# Patient Record
Sex: Female | Born: 1970
Health system: Southern US, Community
[De-identification: ages and names within clinical notes are randomized; demographics above are authoritative.]

## PROBLEM LIST (undated history)

## (undated) DIAGNOSIS — M797 Fibromyalgia: Secondary | ICD-10-CM

## (undated) DIAGNOSIS — Z8614 Personal history of Methicillin resistant Staphylococcus aureus infection: Secondary | ICD-10-CM

## (undated) DIAGNOSIS — K589 Irritable bowel syndrome without diarrhea: Secondary | ICD-10-CM

## (undated) DIAGNOSIS — F319 Bipolar disorder, unspecified: Secondary | ICD-10-CM

## (undated) HISTORY — DX: Bipolar disorder, unspecified: F31.9

## (undated) HISTORY — DX: Irritable bowel syndrome, unspecified: K58.9

## (undated) HISTORY — DX: Fibromyalgia: M79.7

---

## 1993-09-04 HISTORY — PX: WISDOM TOOTH EXTRACTION: SHX21

## 1997-09-04 HISTORY — PX: OTHER SURGICAL HISTORY: SHX169

## 2004-09-04 HISTORY — PX: DILATION AND CURETTAGE OF UTERUS: SHX78

## 2005-09-29 ENCOUNTER — Ambulatory Visit: Payer: Self-pay

## 2006-06-08 ENCOUNTER — Observation Stay: Payer: Self-pay | Admitting: Certified Nurse Midwife

## 2006-06-18 ENCOUNTER — Ambulatory Visit: Payer: Self-pay | Admitting: Urology

## 2006-07-04 ENCOUNTER — Ambulatory Visit: Payer: Self-pay | Admitting: Urology

## 2006-08-13 ENCOUNTER — Encounter: Payer: Self-pay | Admitting: Maternal & Fetal Medicine

## 2006-08-13 ENCOUNTER — Observation Stay: Payer: Self-pay

## 2006-09-07 ENCOUNTER — Inpatient Hospital Stay: Payer: Self-pay | Admitting: Obstetrics and Gynecology

## 2007-08-16 ENCOUNTER — Ambulatory Visit: Payer: Self-pay

## 2008-09-04 HISTORY — PX: AUGMENTATION MAMMAPLASTY: SUR837

## 2008-09-04 HISTORY — PX: BREAST ENHANCEMENT SURGERY: SHX7

## 2008-10-26 ENCOUNTER — Encounter: Admission: RE | Admit: 2008-10-26 | Discharge: 2008-10-26 | Payer: Self-pay | Admitting: Plastic Surgery

## 2009-10-28 ENCOUNTER — Encounter: Admission: RE | Admit: 2009-10-28 | Discharge: 2009-10-28 | Payer: Self-pay | Admitting: Obstetrics and Gynecology

## 2010-09-24 ENCOUNTER — Other Ambulatory Visit: Payer: Self-pay | Admitting: *Deleted

## 2010-09-24 DIAGNOSIS — Z1239 Encounter for other screening for malignant neoplasm of breast: Secondary | ICD-10-CM

## 2010-11-02 ENCOUNTER — Other Ambulatory Visit: Payer: Self-pay | Admitting: *Deleted

## 2010-11-02 ENCOUNTER — Ambulatory Visit
Admission: RE | Admit: 2010-11-02 | Discharge: 2010-11-02 | Disposition: A | Discharge status: Home / Self Care | Payer: BC Managed Care – PPO | Source: Ambulatory Visit | Attending: *Deleted | Admitting: *Deleted

## 2010-11-02 DIAGNOSIS — Z1239 Encounter for other screening for malignant neoplasm of breast: Secondary | ICD-10-CM

## 2011-10-05 ENCOUNTER — Other Ambulatory Visit: Payer: Self-pay | Admitting: Obstetrics and Gynecology

## 2011-10-05 DIAGNOSIS — Z1231 Encounter for screening mammogram for malignant neoplasm of breast: Secondary | ICD-10-CM

## 2011-11-08 ENCOUNTER — Ambulatory Visit
Admission: RE | Admit: 2011-11-08 | Discharge: 2011-11-08 | Disposition: A | Discharge status: Home / Self Care | Payer: BC Managed Care – PPO | Source: Ambulatory Visit | Attending: Obstetrics and Gynecology | Admitting: Obstetrics and Gynecology

## 2011-11-08 DIAGNOSIS — Z1231 Encounter for screening mammogram for malignant neoplasm of breast: Secondary | ICD-10-CM

## 2012-10-09 ENCOUNTER — Other Ambulatory Visit: Payer: Self-pay | Admitting: Obstetrics and Gynecology

## 2012-10-09 DIAGNOSIS — Z1231 Encounter for screening mammogram for malignant neoplasm of breast: Secondary | ICD-10-CM

## 2012-11-13 ENCOUNTER — Ambulatory Visit
Admission: RE | Admit: 2012-11-13 | Discharge: 2012-11-13 | Disposition: A | Discharge status: Home / Self Care | Payer: BC Managed Care – PPO | Source: Ambulatory Visit | Attending: Obstetrics and Gynecology | Admitting: Obstetrics and Gynecology

## 2012-11-13 DIAGNOSIS — Z1231 Encounter for screening mammogram for malignant neoplasm of breast: Secondary | ICD-10-CM

## 2012-12-24 ENCOUNTER — Ambulatory Visit: Payer: Self-pay | Admitting: Family Medicine

## 2013-10-22 ENCOUNTER — Other Ambulatory Visit: Payer: Self-pay

## 2013-10-22 DIAGNOSIS — Z1231 Encounter for screening mammogram for malignant neoplasm of breast: Secondary | ICD-10-CM

## 2013-11-19 ENCOUNTER — Ambulatory Visit
Admission: RE | Admit: 2013-11-19 | Discharge: 2013-11-19 | Disposition: A | Discharge status: Home / Self Care | Payer: BC Managed Care – PPO | Source: Ambulatory Visit

## 2013-11-19 DIAGNOSIS — Z1231 Encounter for screening mammogram for malignant neoplasm of breast: Secondary | ICD-10-CM

## 2014-10-20 ENCOUNTER — Other Ambulatory Visit: Payer: Self-pay

## 2014-10-20 DIAGNOSIS — Z1231 Encounter for screening mammogram for malignant neoplasm of breast: Secondary | ICD-10-CM

## 2015-01-19 ENCOUNTER — Ambulatory Visit
Admission: RE | Admit: 2015-01-19 | Discharge: 2015-01-19 | Disposition: A | Discharge status: Home / Self Care | Payer: BLUE CROSS/BLUE SHIELD | Source: Ambulatory Visit

## 2015-01-19 DIAGNOSIS — Z1231 Encounter for screening mammogram for malignant neoplasm of breast: Secondary | ICD-10-CM

## 2015-01-21 ENCOUNTER — Other Ambulatory Visit: Payer: Self-pay | Admitting: Family Medicine

## 2015-01-21 DIAGNOSIS — R928 Other abnormal and inconclusive findings on diagnostic imaging of breast: Secondary | ICD-10-CM

## 2015-01-26 ENCOUNTER — Ambulatory Visit
Admission: RE | Admit: 2015-01-26 | Discharge: 2015-01-26 | Disposition: A | Discharge status: Home / Self Care | Payer: BLUE CROSS/BLUE SHIELD | Source: Ambulatory Visit | Attending: Family Medicine | Admitting: Family Medicine

## 2015-01-26 DIAGNOSIS — R928 Other abnormal and inconclusive findings on diagnostic imaging of breast: Secondary | ICD-10-CM

## 2015-06-30 ENCOUNTER — Encounter: Payer: Self-pay | Admitting: Physician Assistant

## 2015-06-30 ENCOUNTER — Ambulatory Visit (INDEPENDENT_AMBULATORY_CARE_PROVIDER_SITE_OTHER): Payer: BLUE CROSS/BLUE SHIELD | Admitting: Physician Assistant

## 2015-06-30 VITALS — BP 108/70 | HR 94 | Temp 98.7°F | Resp 14 | Ht 63.0 in | Wt 132.8 lb

## 2015-06-30 DIAGNOSIS — M797 Fibromyalgia: Secondary | ICD-10-CM | POA: Insufficient documentation

## 2015-06-30 DIAGNOSIS — K589 Irritable bowel syndrome without diarrhea: Secondary | ICD-10-CM | POA: Insufficient documentation

## 2015-06-30 DIAGNOSIS — B379 Candidiasis, unspecified: Secondary | ICD-10-CM

## 2015-06-30 DIAGNOSIS — M545 Low back pain, unspecified: Secondary | ICD-10-CM

## 2015-06-30 DIAGNOSIS — N898 Other specified noninflammatory disorders of vagina: Secondary | ICD-10-CM

## 2015-06-30 DIAGNOSIS — Z8659 Personal history of other mental and behavioral disorders: Secondary | ICD-10-CM | POA: Insufficient documentation

## 2015-06-30 LAB — POCT URINALYSIS DIPSTICK
Bilirubin, UA: NEGATIVE
GLUCOSE UA: NEGATIVE
Ketones, UA: NEGATIVE
LEUKOCYTES UA: NEGATIVE
NITRITE UA: NEGATIVE
PH UA: 7
Protein, UA: NEGATIVE
RBC UA: NEGATIVE
Spec Grav, UA: 1.01
UROBILINOGEN UA: 0.2

## 2015-06-30 LAB — POCT URINE PREGNANCY: PREG TEST UR: NEGATIVE

## 2015-06-30 MED ORDER — FLUCONAZOLE 150 MG PO TABS: ORAL_TABLET | ORAL | Status: DC

## 2015-06-30 NOTE — Patient Instructions (Signed)

## 2015-07-01 LAB — POCT WET PREP (WET MOUNT)

## 2015-07-01 NOTE — Progress Notes (Signed)
Patient ID: Tanya Cochran, female   DOB: 09/30/1970, 44 y.o.   MRN: [id number] Name: Tanya Cochran   MRN: [id number]    DOB: 08/26/1971   Date:07/01/2015       Progress Note  Subjective  Chief Complaint  Chief Complaint  Patient presents with  . Back Pain    Patient comes in office today with concerns of lower back pain and pelvic pain for the past 4 days. Patient states that she is near ovulation but has noticed a milky like discharge, patient denies vaginal odor or itching    HPI [name] is a 44 year old female that comes to the office today with new onset low back pain and left-sided pelvic pain. She has also had a thin whitish colored vaginal discharge. She also reports some mild vaginal itching. She states she did have a yeast infection in July of this year. She also reports that she does have a new sexual partner that she had unprotected sex with recently and would like to be checked to make sure she is okay. She does also have a history of ovarian cyst. She has had these since she was in her 12s.  No problem-specific assessment & plan notes found for this encounter.   Past Medical History  Diagnosis Date  . Bipolar disorder (HCC)   . Fibromyalgia   . Irritable bowel syndrome (IBS)     Social History  Substance Use Topics  . Smoking status: Never Smoker   . Smokeless tobacco: Never Used  . Alcohol Use: 0.0 oz/week    0 Standard drinks or equivalent per week     Comment: occasional     Current outpatient prescriptions:  .  lamoTRIgine (LAMICTAL) 200 MG tablet, Take 200 mg by mouth at bedtime., Disp: , Rfl: 4 .  SEROQUEL XR 200 MG 24 hr tablet, Take 200 mg by mouth at bedtime., Disp: , Rfl: 4 .  fluconazole (DIFLUCAN) 150 MG tablet, Take one tablet PO once; if needed repeat in 24 hours; may repeat a third time in 72 hours following second tab if needed, Disp: 3 tablet, Rfl: 0  No Known Allergies  Review of Systems  Constitutional: Negative for fever, chills and  malaise/fatigue.  HENT: Negative.   Eyes: Negative.   Respiratory: Negative.   Cardiovascular: Negative.   Gastrointestinal: Positive for abdominal pain (left sided pelvic pain). Negative for nausea, vomiting, diarrhea and constipation.  Genitourinary: Negative.   Musculoskeletal: Positive for back pain (low back).  Skin: Positive for itching (vaginal).   Objective  Filed Vitals:   06/30/15 1551  BP: 108/70  Pulse: 94  Temp: 98.7 F (37.1 C)  TempSrc: Oral  Resp: 14  Height:  (1.6 m)  Weight: 132 lb 12.8 oz (60.238 kg)     Physical Exam  Constitutional: She is well-developed, well-nourished, and in no distress. No distress.  Cardiovascular: Normal rate and regular rhythm.  Exam reveals no gallop and no friction rub.   No murmur heard. Pulmonary/Chest: Effort normal and breath sounds normal. No respiratory distress. She has no wheezes. She has no rales.  Abdominal: Soft. Bowel sounds are normal. She exhibits no mass. There is no tenderness. There is no rebound.  Genitourinary: Cervix normal, right adnexa normal, left adnexa normal and vulva normal. Right adnexum displays no mass. Left adnexum displays no mass. Vagina exhibits normal mucosa, no exudate, no lesion and no rugosity. Thin  odorless  white and vaginal discharge found.  Skin: She is not diaphoretic.  Vitals reviewed.    Recent Results (from the past 2160 hour(s))  POCT urinalysis dipstick     Status: Normal   Collection Time: 06/30/15  3:49 PM  Result Value Ref Range   Color, UA yellow    Clarity, UA clear    Glucose, UA negative    Bilirubin, UA negative    Ketones, UA negative    Spec Grav, UA 1.010    Blood, UA negative    pH, UA 7.0    Protein, UA negative    Urobilinogen, UA 0.2    Nitrite, UA negative    Leukocytes, UA Negative Negative  POCT urine pregnancy     Status: Normal   Collection Time: 06/30/15  3:58 PM  Result Value Ref Range   Preg Test, Ur Negative Negative  POCT Wet Prep Mellody Drown([facility]  [place])     Status: Abnormal   Collection Time: 07/01/15  6:45 PM  Result Value Ref Range   Source Wet Prep POC vaginal    WBC, Wet Prep HPF POC 0-3    Bacteria Wet Prep HPF POC None None, Few, Too numerous to count   BACTERIA WET PREP MORPHOLOGY POC     Clue Cells Wet Prep HPF POC None None, Too numerous to count   Clue Cells Wet Prep Whiff POC     Yeast Wet Prep HPF POC Few    KOH Wet Prep POC     Trichomonas Wet Prep HPF POC none      Assessment & Plan  1. Yeast infection Upon evaluation of the wet prep in the office she did have yeast present. I will treat her with Diflucan as below. She is to call the office if symptoms fail to improve or worsen. - fluconazole (DIFLUCAN) 150 MG tablet; Take one tablet PO once; if needed repeat in 24 hours; may repeat a third time in 72 hours following second tab if needed  Dispense: 3 tablet; Refill: 0  2. Bilateral low back pain without sciatica UA was negative for UTI. Urine pregnancy was negative as well. Yeast were seen on wet prep and was treated with Diflucan as above. - POCT urinalysis dipstick - POCT urine pregnancy - POCT Wet Prep [facility] [place])  3. Vaginal discharge See above medical treatment plan for yeast infection. - POCT urine pregnancy - POCT Wet [place] ([place]) - GC/Chlamydia Probe Amp - Body Fluid Culture

## 2015-07-02 ENCOUNTER — Telehealth: Payer: Self-pay

## 2015-07-02 LAB — GC/CHLAMYDIA PROBE AMP
CHLAMYDIA, DNA PROBE: NEGATIVE
NEISSERIA GONORRHOEAE BY PCR: NEGATIVE

## 2015-07-02 NOTE — Telephone Encounter (Signed)
-----   Message from Margaretann LovelessJennifer M Burnette, PA-C sent at 07/02/2015  8:14 AM EDT ----- Negative for GC/chlamydia

## 2015-07-02 NOTE — Telephone Encounter (Signed)
Patient advised as directed below.  Thanks,  -Joseline 

## 2015-07-04 LAB — GENITAL CULTURE

## 2015-07-05 ENCOUNTER — Telehealth: Payer: Self-pay

## 2015-07-05 NOTE — Progress Notes (Signed)
Patient advised of results.

## 2015-07-05 NOTE — Telephone Encounter (Signed)
Patient advised as directed below.  Thanks.  -Sharryn Belding 

## 2015-07-05 NOTE — Telephone Encounter (Signed)
-----   Message from Margaretann LovelessJennifer M Burnette, [place]PA-C sent at 07/05/2015  8:36 AM EDT ----- Culture was positive for yeast. All other testing was negative.

## 2015-12-24 ENCOUNTER — Other Ambulatory Visit: Payer: Self-pay

## 2015-12-24 DIAGNOSIS — Z1231 Encounter for screening mammogram for malignant neoplasm of breast: Secondary | ICD-10-CM

## 2016-02-08 ENCOUNTER — Ambulatory Visit
Admission: RE | Admit: 2016-02-08 | Discharge: 2016-02-08 | Disposition: A | Discharge status: Home / Self Care | Payer: BLUE CROSS/BLUE SHIELD | Source: Ambulatory Visit

## 2016-02-08 ENCOUNTER — Other Ambulatory Visit: Payer: Self-pay | Admitting: Family Medicine

## 2016-02-08 DIAGNOSIS — Z1231 Encounter for screening mammogram for malignant neoplasm of breast: Secondary | ICD-10-CM

## 2016-02-23 ENCOUNTER — Other Ambulatory Visit: Payer: Self-pay | Admitting: Family Medicine

## 2016-02-23 DIAGNOSIS — R928 Other abnormal and inconclusive findings on diagnostic imaging of breast: Secondary | ICD-10-CM

## 2016-03-08 ENCOUNTER — Ambulatory Visit
Admission: RE | Admit: 2016-03-08 | Discharge: 2016-03-08 | Disposition: A | Discharge status: Home / Self Care | Payer: BLUE CROSS/BLUE SHIELD | Source: Ambulatory Visit | Attending: Family Medicine | Admitting: Family Medicine

## 2016-03-08 DIAGNOSIS — N6001 Solitary cyst of right breast: Secondary | ICD-10-CM | POA: Diagnosis not present

## 2016-03-08 DIAGNOSIS — R928 Other abnormal and inconclusive findings on diagnostic imaging of breast: Secondary | ICD-10-CM

## 2016-03-08 DIAGNOSIS — N63 Unspecified lump in breast: Secondary | ICD-10-CM | POA: Diagnosis not present

## 2016-04-18 DIAGNOSIS — F3181 Bipolar II disorder: Secondary | ICD-10-CM | POA: Diagnosis not present

## 2016-04-18 DIAGNOSIS — Z79899 Other long term (current) drug therapy: Secondary | ICD-10-CM | POA: Diagnosis not present

## 2016-04-18 DIAGNOSIS — F419 Anxiety disorder, unspecified: Secondary | ICD-10-CM | POA: Diagnosis not present

## 2016-06-28 DIAGNOSIS — Z1239 Encounter for other screening for malignant neoplasm of breast: Secondary | ICD-10-CM | POA: Diagnosis not present

## 2016-06-28 DIAGNOSIS — N946 Dysmenorrhea, unspecified: Secondary | ICD-10-CM | POA: Diagnosis not present

## 2016-06-28 DIAGNOSIS — Z124 Encounter for screening for malignant neoplasm of cervix: Secondary | ICD-10-CM | POA: Diagnosis not present

## 2016-06-28 DIAGNOSIS — Z01419 Encounter for gynecological examination (general) (routine) without abnormal findings: Secondary | ICD-10-CM | POA: Diagnosis not present

## 2016-06-28 DIAGNOSIS — N926 Irregular menstruation, unspecified: Secondary | ICD-10-CM | POA: Diagnosis not present

## 2016-06-28 DIAGNOSIS — Z1151 Encounter for screening for human papillomavirus (HPV): Secondary | ICD-10-CM | POA: Diagnosis not present

## 2016-06-28 DIAGNOSIS — N920 Excessive and frequent menstruation with regular cycle: Secondary | ICD-10-CM | POA: Diagnosis not present

## 2016-07-11 DIAGNOSIS — N920 Excessive and frequent menstruation with regular cycle: Secondary | ICD-10-CM | POA: Diagnosis not present

## 2016-07-11 DIAGNOSIS — N921 Excessive and frequent menstruation with irregular cycle: Secondary | ICD-10-CM | POA: Diagnosis not present

## 2016-07-11 DIAGNOSIS — N946 Dysmenorrhea, unspecified: Secondary | ICD-10-CM | POA: Diagnosis not present

## 2017-01-24 ENCOUNTER — Other Ambulatory Visit: Payer: Self-pay | Admitting: Family Medicine

## 2017-01-24 DIAGNOSIS — Z1231 Encounter for screening mammogram for malignant neoplasm of breast: Secondary | ICD-10-CM

## 2017-02-21 ENCOUNTER — Ambulatory Visit
Admission: RE | Admit: 2017-02-21 | Discharge: 2017-02-21 | Disposition: A | Discharge status: Home / Self Care | Payer: BLUE CROSS/BLUE SHIELD | Source: Ambulatory Visit | Attending: Family Medicine | Admitting: Family Medicine

## 2017-02-21 DIAGNOSIS — Z1231 Encounter for screening mammogram for malignant neoplasm of breast: Secondary | ICD-10-CM | POA: Diagnosis not present

## 2017-02-22 ENCOUNTER — Other Ambulatory Visit: Payer: Self-pay | Admitting: Family Medicine

## 2017-02-22 DIAGNOSIS — R928 Other abnormal and inconclusive findings on diagnostic imaging of breast: Secondary | ICD-10-CM

## 2017-02-27 ENCOUNTER — Ambulatory Visit
Admission: RE | Admit: 2017-02-27 | Discharge: 2017-02-27 | Disposition: A | Discharge status: Home / Self Care | Payer: BLUE CROSS/BLUE SHIELD | Source: Ambulatory Visit | Attending: Family Medicine | Admitting: Family Medicine

## 2017-02-27 DIAGNOSIS — R928 Other abnormal and inconclusive findings on diagnostic imaging of breast: Secondary | ICD-10-CM

## 2017-02-27 DIAGNOSIS — R922 Inconclusive mammogram: Secondary | ICD-10-CM | POA: Diagnosis not present

## 2017-02-28 NOTE — Progress Notes (Signed)
Advised  ED 

## 2017-03-28 DIAGNOSIS — F3181 Bipolar II disorder: Secondary | ICD-10-CM | POA: Diagnosis not present

## 2017-03-28 DIAGNOSIS — F419 Anxiety disorder, unspecified: Secondary | ICD-10-CM | POA: Diagnosis not present

## 2017-05-01 DIAGNOSIS — F4322 Adjustment disorder with anxiety: Secondary | ICD-10-CM | POA: Diagnosis not present

## 2017-05-09 DIAGNOSIS — M5136 Other intervertebral disc degeneration, lumbar region: Secondary | ICD-10-CM | POA: Diagnosis not present

## 2017-05-09 DIAGNOSIS — M5431 Sciatica, right side: Secondary | ICD-10-CM | POA: Diagnosis not present

## 2017-05-23 DIAGNOSIS — M545 Low back pain: Secondary | ICD-10-CM | POA: Diagnosis not present

## 2017-06-13 DIAGNOSIS — M5136 Other intervertebral disc degeneration, lumbar region: Secondary | ICD-10-CM | POA: Diagnosis not present

## 2017-06-13 DIAGNOSIS — M545 Low back pain: Secondary | ICD-10-CM | POA: Diagnosis not present

## 2017-06-13 DIAGNOSIS — M47816 Spondylosis without myelopathy or radiculopathy, lumbar region: Secondary | ICD-10-CM | POA: Diagnosis not present

## 2017-06-13 DIAGNOSIS — M5416 Radiculopathy, lumbar region: Secondary | ICD-10-CM | POA: Diagnosis not present

## 2017-06-22 DIAGNOSIS — M5126 Other intervertebral disc displacement, lumbar region: Secondary | ICD-10-CM | POA: Diagnosis not present

## 2017-06-22 DIAGNOSIS — M545 Low back pain: Secondary | ICD-10-CM | POA: Diagnosis not present

## 2017-06-22 DIAGNOSIS — M5416 Radiculopathy, lumbar region: Secondary | ICD-10-CM | POA: Diagnosis not present

## 2017-06-22 DIAGNOSIS — M5106 Intervertebral disc disorders with myelopathy, lumbar region: Secondary | ICD-10-CM | POA: Diagnosis not present

## 2017-06-26 DIAGNOSIS — M5126 Other intervertebral disc displacement, lumbar region: Secondary | ICD-10-CM | POA: Diagnosis not present

## 2017-06-26 DIAGNOSIS — M5416 Radiculopathy, lumbar region: Secondary | ICD-10-CM | POA: Diagnosis not present

## 2017-06-26 DIAGNOSIS — M545 Low back pain: Secondary | ICD-10-CM | POA: Diagnosis not present

## 2017-06-26 DIAGNOSIS — M5106 Intervertebral disc disorders with myelopathy, lumbar region: Secondary | ICD-10-CM | POA: Diagnosis not present

## 2017-06-28 DIAGNOSIS — M5106 Intervertebral disc disorders with myelopathy, lumbar region: Secondary | ICD-10-CM | POA: Diagnosis not present

## 2017-06-28 DIAGNOSIS — M5126 Other intervertebral disc displacement, lumbar region: Secondary | ICD-10-CM | POA: Diagnosis not present

## 2017-06-28 DIAGNOSIS — M545 Low back pain: Secondary | ICD-10-CM | POA: Diagnosis not present

## 2017-06-28 DIAGNOSIS — M5416 Radiculopathy, lumbar region: Secondary | ICD-10-CM | POA: Diagnosis not present

## 2017-07-10 DIAGNOSIS — F3181 Bipolar II disorder: Secondary | ICD-10-CM | POA: Diagnosis not present

## 2017-07-10 DIAGNOSIS — Z79899 Other long term (current) drug therapy: Secondary | ICD-10-CM | POA: Diagnosis not present

## 2017-07-10 DIAGNOSIS — F419 Anxiety disorder, unspecified: Secondary | ICD-10-CM | POA: Diagnosis not present

## 2017-07-11 ENCOUNTER — Encounter: Payer: Self-pay | Admitting: Obstetrics and Gynecology

## 2017-07-11 ENCOUNTER — Ambulatory Visit (INDEPENDENT_AMBULATORY_CARE_PROVIDER_SITE_OTHER): Payer: BLUE CROSS/BLUE SHIELD | Admitting: Obstetrics and Gynecology

## 2017-07-11 VITALS — BP 100/60 | HR 92 | Ht 63.0 in | Wt 131.0 lb

## 2017-07-11 DIAGNOSIS — Z124 Encounter for screening for malignant neoplasm of cervix: Secondary | ICD-10-CM | POA: Diagnosis not present

## 2017-07-11 DIAGNOSIS — Z01419 Encounter for gynecological examination (general) (routine) without abnormal findings: Secondary | ICD-10-CM

## 2017-07-11 DIAGNOSIS — Z1239 Encounter for other screening for malignant neoplasm of breast: Secondary | ICD-10-CM

## 2017-07-11 DIAGNOSIS — R87810 Cervical high risk human papillomavirus (HPV) DNA test positive: Secondary | ICD-10-CM | POA: Diagnosis not present

## 2017-07-11 DIAGNOSIS — Z1231 Encounter for screening mammogram for malignant neoplasm of breast: Secondary | ICD-10-CM | POA: Diagnosis not present

## 2017-07-11 DIAGNOSIS — Z1151 Encounter for screening for human papillomavirus (HPV): Secondary | ICD-10-CM | POA: Diagnosis not present

## 2017-07-11 LAB — HM PAP SMEAR: HM Pap smear: NORMAL

## 2017-07-11 NOTE — Progress Notes (Signed)
PCP:  Lorie PhenixMaloney, Nancy, MD   Chief Complaint  Patient presents with  . Gynecologic Exam     HPI:      [name] is a 46 y.o. [id number] who LMP was Patient's last menstrual period was 06/24/2017., presents today for her annual examination.  Her menses are Q3-4 wks, lasting 6 days.  Dysmenorrhea mild, occurring first 1-2 days of flow. She does not have intermenstrual bleeding. She noted heavier flow last yr with menses coming Q3 wks instead of 4 wks. GYN u/s and labs were negative. Pt states sx are tolerable. Not interested in IUD, ablation, lysteda.   Sex activity: single partner, contraception - vasectomy.  Last Pap: 06/28/16 Results were: no abnormalities /POS HPV DNA . Repeat pap due today. Hx of STDs: HPV  Last mammogram: February 27, 2017  Results were: normal--routine follow-up in 12 months There is no FH of breast cancer. There is no FH of ovarian cancer. The patient does not do self-breast exams.  Tobacco use: The patient denies current or previous tobacco use. Alcohol use: none No drug use.  Exercise: moderately active  She does get adequate calcium and Vitamin D in her diet.   Past Medical History:  Diagnosis Date  . Bipolar disorder (HCC)   . Fibromyalgia   . Irritable bowel syndrome (IBS)     Past Surgical History:  Procedure Laterality Date  . BREAST ENHANCEMENT SURGERY  2010  . laproscropic ovarion cyst  1999  . WISDOM TOOTH EXTRACTION  1995    Family History  Problem Relation Age of Onset  . Diabetes Mother        non-insulin dependent  . ALS Maternal Grandmother   . Cancer Maternal Grandfather        prostate  . Aortic aneurysm Maternal Grandfather   . Stroke Paternal Grandmother   . Aortic aneurysm Paternal Grandfather     Social History   Socioeconomic History  . Marital status: Divorced    Spouse name: Not on file  . Number of children: Not on file  . Years of education: Not on file  . Highest education level: Not on file  Social Needs   . Financial resource strain: Not on file  . Food insecurity - worry: Not on file  . Food insecurity - inability: Not on file  . Transportation needs - medical: Not on file  . Transportation needs - non-medical: Not on file  Occupational History  . Not on file  Tobacco Use  . Smoking status: Never Smoker  . Smokeless tobacco: Never Used  Substance and Sexual Activity  . Alcohol use: Yes    Alcohol/week: 0.0 oz    Comment: occasional  . Drug use: No  . Sexual activity: Yes  Other Topics Concern  . Not on file  Social History Narrative  . Not on file    Current Meds  Medication Sig  . lamoTRIgine (LAMICTAL) 200 MG tablet Take 200 mg by mouth at bedtime.  . SEROQUEL XR 200 MG 24 hr tablet Take 200 mg by mouth at bedtime.  Marland Kitchen. tiZANidine (ZANAFLEX) 4 MG tablet TAKE 0.5-1 TABLET BY MOUTH 3 TIMES A DAY AS NEEDED FOR SPASMS     ROS:  Review of Systems  Constitutional: Negative for fatigue, fever and unexpected weight change.  Respiratory: Negative for cough, shortness of breath and wheezing.   Cardiovascular: Negative for chest pain, palpitations and leg swelling.  Gastrointestinal: Negative for blood in stool, constipation, diarrhea, nausea and vomiting.  Endocrine: Negative for cold intolerance, heat intolerance and polyuria.  Genitourinary: Negative for dyspareunia, dysuria, flank pain, frequency, genital sores, hematuria, menstrual problem, pelvic pain, urgency, vaginal bleeding, vaginal discharge and vaginal pain.  Musculoskeletal: Negative for back pain, joint swelling and myalgias.  Skin: Negative for rash.  Neurological: Negative for dizziness, syncope, light-headedness, numbness and headaches.  Hematological: Negative for adenopathy.  Psychiatric/Behavioral: Negative for agitation, confusion, sleep disturbance and suicidal ideas. The patient is not nervous/anxious.      Objective: BP 100/60   Pulse 92   Ht 5\' 3"  (1.6 m)   Wt 131 lb (59.4 kg)   LMP 06/24/2017    BMI 23.21 kg/m    Physical Exam  Constitutional: She is oriented to person, place, and time. She appears well-developed and well-nourished.  Genitourinary: Vagina normal and uterus normal. There is no rash or tenderness on the right labia. There is no rash or tenderness on the left labia. No erythema or tenderness in the vagina. No vaginal discharge found. Right adnexum does not display mass and does not display tenderness. Left adnexum does not display mass and does not display tenderness. Cervix does not exhibit motion tenderness or polyp. Uterus is not enlarged or tender.  Neck: Normal range of motion. No thyromegaly present.  Cardiovascular: Normal rate, regular rhythm and normal heart sounds.  No murmur heard. Pulmonary/Chest: Effort normal and breath sounds normal. Right breast exhibits no mass, no nipple discharge, no skin change and no tenderness. Left breast exhibits no mass, no nipple discharge, no skin change and no tenderness.  Abdominal: Soft. There is no tenderness. There is no guarding.  Musculoskeletal: Normal range of motion.  Neurological: She is alert and oriented to person, place, and time. No cranial nerve deficit.  Psychiatric: She has a normal mood and affect. Her behavior is normal.  Vitals reviewed.   Assessment/Plan: Encounter for annual routine gynecological examination  Cervical cancer screening - Plan: IGP, Aptima HPV  Screening for HPV (human papillomavirus) - Plan: IGP, Aptima HPV  Cervical high risk human papillomavirus (HPV) DNA test positive - Repeat pap today. Will call pt with results. Will colpo if still abn.  Screening for breast cancer - PT current on mammo.  GYN counsel breast self exam, mammography screening, adequate intake of calcium and vitamin D, diet and exercise     F/U  Return in about 1 year (around 07/11/2018).  Bentlee [place] B. Yaroslav Gombos, PA-C 07/11/2017 2:42 PM

## 2017-07-11 NOTE — Patient Instructions (Signed)
I value your feedback and appreciate you entrusting us with your care. If you get a [facility] patient survey, I would appreciate you taking the time to let us know what your experience was like. Thank you! 

## 2017-07-13 LAB — IGP, APTIMA HPV
HPV Aptima: NEGATIVE
PAP Smear Comment: 0

## 2017-07-19 DIAGNOSIS — M5416 Radiculopathy, lumbar region: Secondary | ICD-10-CM | POA: Diagnosis not present

## 2017-07-19 DIAGNOSIS — M545 Low back pain: Secondary | ICD-10-CM | POA: Diagnosis not present

## 2017-07-19 DIAGNOSIS — M5126 Other intervertebral disc displacement, lumbar region: Secondary | ICD-10-CM | POA: Diagnosis not present

## 2017-07-31 ENCOUNTER — Ambulatory Visit (INDEPENDENT_AMBULATORY_CARE_PROVIDER_SITE_OTHER): Payer: BLUE CROSS/BLUE SHIELD | Admitting: Physician Assistant

## 2017-07-31 ENCOUNTER — Encounter: Payer: Self-pay | Admitting: Physician Assistant

## 2017-07-31 VITALS — BP 108/64 | HR 84 | Temp 98.2°F | Resp 16 | Wt 130.0 lb

## 2017-07-31 DIAGNOSIS — J069 Acute upper respiratory infection, unspecified: Secondary | ICD-10-CM | POA: Diagnosis not present

## 2017-07-31 DIAGNOSIS — R062 Wheezing: Secondary | ICD-10-CM

## 2017-07-31 DIAGNOSIS — B9789 Other viral agents as the cause of diseases classified elsewhere: Secondary | ICD-10-CM | POA: Diagnosis not present

## 2017-07-31 MED ORDER — ALBUTEROL SULFATE HFA 108 (90 BASE) MCG/ACT IN AERS: 2.0000 | INHALATION_SPRAY | Freq: Four times a day (QID) | RESPIRATORY_TRACT | 2 refills | Status: DC | PRN

## 2017-07-31 MED ORDER — PROMETHAZINE-DM 6.25-15 MG/5ML PO SYRP: 5.0000 mL | ORAL_SOLUTION | Freq: Every evening | ORAL | 0 refills | Status: DC | PRN

## 2017-07-31 NOTE — Patient Instructions (Signed)
Upper Respiratory Infection, Adult Most upper respiratory infections (URIs) are caused by a virus. A URI affects the nose, throat, and upper air passages. The most common type of URI is often called "the common cold." Follow these instructions at home:  Take medicines only as told by your doctor.  Gargle warm saltwater or take cough drops to comfort your throat as told by your doctor.  Use a warm mist humidifier or inhale steam from a shower to increase air moisture. This may make it easier to breathe.  Drink enough fluid to keep your pee (urine) clear or pale yellow.  Eat soups and other clear broths.  Have a healthy diet.  Rest as needed.  Go back to work when your fever is gone or your doctor says it is okay. ? You may need to stay home longer to avoid giving your URI to others. ? You can also wear a face mask and wash your hands often to prevent spread of the virus.  Use your inhaler more if you have asthma.  Do not use any tobacco products, including cigarettes, chewing tobacco, or electronic cigarettes. If you need help quitting, ask your doctor. Contact a doctor if:  You are getting worse, not better.  Your symptoms are not helped by medicine.  You have chills.  You are getting more short of breath.  You have brown or red mucus.  You have yellow or brown discharge from your nose.  You have pain in your face, especially when you bend forward.  You have a fever.  You have puffy (swollen) neck glands.  You have pain while swallowing.  You have white areas in the back of your throat. Get help right away if:  You have very bad or constant: ? Headache. ? Ear pain. ? Pain in your forehead, behind your eyes, and over your cheekbones (sinus pain). ? Chest pain.  You have long-lasting (chronic) lung disease and any of the following: ? Wheezing. ? Long-lasting cough. ? Coughing up blood. ? A change in your usual mucus.  You have a stiff neck.  You have  changes in your: ? Vision. ? Hearing. ? Thinking. ? Mood. This information is not intended to replace advice given to you by your health care provider. Make sure you discuss any questions you have with your health care provider. Document Released: 02/07/2008 Document Revised: 04/23/2016 Document Reviewed: 11/26/2013 Elsevier Interactive Patient Education  2018 Elsevier Inc.  

## 2017-07-31 NOTE — Progress Notes (Signed)
[facility]  Chief Complaint  Patient presents with  . URI    Started Saturday 07/28/2017    Subjective:    Patient ID: Tanya Cochran, female    DOB: [birth year], 46 y.o.   MRN: [id number]  Upper Respiratory Infection: Tanya Cochran is a 46 y.o. female complaining of symptoms of a URI. Symptoms include congestion, cough, plugged sensation in both ears and sore throat. Onset of symptoms was 4 days ago, gradually worsening since that time. She also c/o congestion, cough described as productive, nasal congestion and post nasal drip for the past 4 days .  She is drinking plenty of fluids. Evaluation to date: none. Treatment to date: cough suppressants and decongestants. The treatment has provided minimal.   Review of Systems  Constitutional: Positive for fatigue. Negative for activity change, appetite change, chills, diaphoresis, fever and unexpected weight change.  HENT: Positive for congestion, postnasal drip, rhinorrhea, sinus pressure, sinus pain, sneezing, sore throat and tinnitus. Negative for ear discharge, ear pain, nosebleeds and trouble swallowing.   Eyes: Positive for discharge. Negative for photophobia, pain, redness, itching and visual disturbance.  Respiratory: Positive for cough and chest tightness. Negative for apnea, choking, shortness of breath, wheezing and stridor.   Gastrointestinal: Negative.   Neurological: Negative for dizziness, light-headedness and headaches (Pt reports having a bad headache on Saturday).       Objective:   BP 108/64 (BP Location: Right Arm, Patient Position: Sitting, Cuff Size: Normal)   Pulse 84   Temp 98.2 F (36.8 C) (Oral)   Resp 16   Wt 130 lb (59 kg)   LMP 07/22/2017   BMI 23.03 kg/m   Patient Active Problem List   Diagnosis Date Noted  . Irritable bowel syndrome 06/30/2015  . Fibromyalgia 06/30/2015  . H/O manic depressive disorder 06/30/2015    Outpatient Encounter Medications as of  07/31/2017  Medication Sig Note  . diclofenac (VOLTAREN) 75 MG EC tablet Take 75 mg by mouth 2 (two) times daily with a meal.   . lamoTRIgine (LAMICTAL) 200 MG tablet Take 200 mg by mouth at bedtime. 06/30/2015: Received from: External Pharmacy  . SEROQUEL XR 200 MG 24 hr tablet Take 200 mg by mouth at bedtime. 06/30/2015: Received from: External Pharmacy  . tiZANidine (ZANAFLEX) 4 MG tablet TAKE 0.5-1 TABLET BY MOUTH 3 TIMES A DAY AS NEEDED FOR SPASMS   . albuterol (PROVENTIL HFA;VENTOLIN HFA) 108 (90 Base) MCG/ACT inhaler Inhale 2 puffs into the lungs every 6 (six) hours as needed for wheezing or shortness of breath.   . fluconazole (DIFLUCAN) 150 MG tablet Take one tablet PO once; if needed repeat in 24 hours; may repeat a third time in 72 hours following second tab if needed (Patient not taking: Reported on 07/11/2017)   . promethazine-dextromethorphan (PROMETHAZINE-DM) 6.25-15 MG/5ML syrup Take 5 mLs by mouth at bedtime as needed for cough.    No facility-administered encounter medications on file as of 07/31/2017.     No Known Allergies     Physical Exam  Constitutional: She is oriented to person, place, and time. She appears well-developed and well-nourished.  HENT:  Right Ear: External ear normal.  Left Ear: External ear normal.  Nose: Right sinus exhibits frontal sinus tenderness. Left sinus exhibits frontal sinus tenderness.  Mouth/Throat: Oropharynx is clear and moist. No oropharyngeal exudate.  TMs opaque bilaterally  Cardiovascular: Normal rate and regular rhythm.  Pulmonary/Chest: Effort normal. She has wheezes.  Slight expiratory wheeze in RUF  Lymphadenopathy:    She has cervical adenopathy.  Neurological: She is alert and oriented to person, place, and time.  Psychiatric: She has a normal mood and affect. Her behavior is normal.       Assessment & Plan:  1. Viral URI with cough  Counseled on course of viral URI 7-10 days. Encouraged to call back in one week if  worsening, double sickening, etc. Will call in Augmentin at that time.  - promethazine-dextromethorphan (PROMETHAZINE-DM) 6.25-15 MG/5ML syrup; Take 5 mLs by mouth at bedtime as needed for cough.  Dispense: 118 mL; Refill: 0  2. Wheezing  - albuterol (PROVENTIL HFA;VENTOLIN HFA) 108 (90 Base) MCG/ACT inhaler; Inhale 2 puffs into the lungs every 6 (six) hours as needed for wheezing or shortness of breath.  Dispense: 1 Inhaler; Refill: 2  Return if symptoms worsen or fail to improve.  The entirety of the information documented in the History of Present Illness, Review of Systems and Physical Exam were personally obtained by me. Portions of this information were initially documented by Kavin LeechLaura Walsh, CMA and reviewed by me for thoroughness and accuracy.

## 2017-08-16 DIAGNOSIS — M5126 Other intervertebral disc displacement, lumbar region: Secondary | ICD-10-CM | POA: Diagnosis not present

## 2017-08-16 DIAGNOSIS — M5416 Radiculopathy, lumbar region: Secondary | ICD-10-CM | POA: Diagnosis not present

## 2017-08-16 DIAGNOSIS — M545 Low back pain: Secondary | ICD-10-CM | POA: Diagnosis not present

## 2017-09-19 DIAGNOSIS — H5213 Myopia, bilateral: Secondary | ICD-10-CM | POA: Diagnosis not present

## 2017-12-14 ENCOUNTER — Ambulatory Visit (INDEPENDENT_AMBULATORY_CARE_PROVIDER_SITE_OTHER): Payer: BLUE CROSS/BLUE SHIELD

## 2017-12-14 ENCOUNTER — Other Ambulatory Visit: Payer: Self-pay

## 2017-12-14 ENCOUNTER — Ambulatory Visit
Admission: EM | Admit: 2017-12-14 | Discharge: 2017-12-14 | Disposition: A | Discharge status: Home / Self Care | Payer: BLUE CROSS/BLUE SHIELD | Attending: Family Medicine | Admitting: Family Medicine

## 2017-12-14 ENCOUNTER — Encounter: Payer: Self-pay | Admitting: Emergency Medicine

## 2017-12-14 DIAGNOSIS — R197 Diarrhea, unspecified: Secondary | ICD-10-CM

## 2017-12-14 DIAGNOSIS — R1030 Lower abdominal pain, unspecified: Secondary | ICD-10-CM | POA: Diagnosis not present

## 2017-12-14 DIAGNOSIS — R109 Unspecified abdominal pain: Secondary | ICD-10-CM | POA: Diagnosis not present

## 2017-12-14 DIAGNOSIS — R6883 Chills (without fever): Secondary | ICD-10-CM | POA: Diagnosis not present

## 2017-12-14 DIAGNOSIS — A09 Infectious gastroenteritis and colitis, unspecified: Secondary | ICD-10-CM | POA: Diagnosis not present

## 2017-12-14 DIAGNOSIS — Z79899 Other long term (current) drug therapy: Secondary | ICD-10-CM | POA: Insufficient documentation

## 2017-12-14 DIAGNOSIS — Z833 Family history of diabetes mellitus: Secondary | ICD-10-CM | POA: Diagnosis not present

## 2017-12-14 LAB — COMPREHENSIVE METABOLIC PANEL WITH GFR
ALT: 14 U/L (ref 14–54)
AST: 19 U/L (ref 15–41)
Albumin: 4.6 g/dL (ref 3.5–5.0)
Alkaline Phosphatase: 46 U/L (ref 38–126)
Anion gap: 7 (ref 5–15)
BUN: 12 mg/dL (ref 6–20)
CO2: 28 mmol/L (ref 22–32)
Calcium: 9.2 mg/dL (ref 8.9–10.3)
Chloride: 102 mmol/L (ref 101–111)
Creatinine, Ser: 1.12 mg/dL — ABNORMAL HIGH (ref 0.44–1.00)
GFR calc Af Amer: >60 mL/min
GFR calc non Af Amer: 58 mL/min — ABNORMAL LOW
Glucose, Bld: 93 mg/dL (ref 65–99)
Potassium: 4.2 mmol/L (ref 3.5–5.1)
Sodium: 137 mmol/L (ref 135–145)
Total Bilirubin: 1 mg/dL (ref 0.3–1.2)
Total Protein: 7.7 g/dL (ref 6.5–8.1)

## 2017-12-14 MED ORDER — VANCOMYCIN HCL 125 MG PO CAPS: 125.0000 mg | ORAL_CAPSULE | Freq: Four times a day (QID) | ORAL | 0 refills | Status: DC

## 2017-12-14 NOTE — ED Provider Notes (Signed)
[facility]    CSN: [id number] Arrival date & time: 12/14/17  1334     History   Chief [id number] Chief Complaint  Patient presents with  . Diarrhea    HPI Tanya Cochran is a 47 y.o. female.   The history is provided by the patient.  Diarrhea  Quality:  Mucous and watery Severity:  Moderate Onset quality:  Sudden Number of episodes:  3-4 per day Duration:  2 weeks Timing:  Constant Progression:  Worsening Relieved by:  None tried Ineffective treatments:  None tried Associated symptoms: chills   Associated symptoms: no arthralgias, no recent cough, no diaphoresis, no fever, no headaches, no myalgias, no URI and no vomiting   Risk factors: no recent antibiotic use, no sick contacts, no suspicious food intake and no travel to endemic areas     Past Medical History:  Diagnosis Date  . Bipolar disorder (HCC)   . Fibromyalgia   . Irritable bowel syndrome (IBS)     Patient Active Problem List   Diagnosis Date Noted  . Irritable bowel syndrome 06/30/2015  . Fibromyalgia 06/30/2015  . H/O manic depressive disorder 06/30/2015    Past Surgical History:  Procedure Laterality Date  . BREAST ENHANCEMENT SURGERY  2010  . laproscropic ovarion cyst  1999  . WISDOM TOOTH EXTRACTION  1995    OB History    Gravida  3   Para  2   Term  2   Preterm      AB  1   Living  2     SAB      TAB      Ectopic  1   Multiple      Live Births               Home Medications    Prior to Admission medications   Medication Sig Start Date End Date Taking? Authorizing Provider  lamoTRIgine (LAMICTAL) 200 MG tablet Take 200 mg by mouth at bedtime. 06/06/15  Yes [provider]  SEROQUEL XR 200 MG 24 hr tablet Take 200 mg by mouth at bedtime. 06/02/15  Yes [provider]  albuterol (PROVENTIL HFA;VENTOLIN HFA) 108 (90 Base) MCG/ACT inhaler Inhale 2 puffs into the lungs every 6 (six) hours as needed for wheezing or shortness of breath.  07/31/17   Trey SailorsPollak, Adriana M, PA-C  diclofenac (VOLTAREN) 75 MG EC tablet Take 75 mg by mouth 2 (two) times daily with a meal. 07/20/17   [provider]  fluconazole (DIFLUCAN) 150 MG tablet Take one tablet PO once; if needed repeat in 24 hours; may repeat a third time in 72 hours following second tab if needed Patient not taking: Reported on 07/11/2017 06/30/15   Margaretann LovelessBurnette, Jennifer M, PA-C  promethazine-dextromethorphan (PROMETHAZINE-DM) 6.25-15 MG/5ML syrup Take 5 mLs by mouth at bedtime as needed for cough. 07/31/17   Trey SailorsPollak, Adriana M, PA-C  tiZANidine (ZANAFLEX) 4 MG tablet TAKE 0.5-1 TABLET BY MOUTH 3 TIMES A DAY AS NEEDED FOR SPASMS 06/13/17   [provider]  vancomycin (VANCOCIN) 125 MG capsule Take 1 capsule (125 mg total) by mouth 4 (four) times daily. 12/14/17   Payton Mccallumonty, Aleigh Grunden, MD    Family History Family History  Problem Relation Age of Onset  . Diabetes Mother        non-insulin dependent  . ALS Maternal Grandmother   . Cancer Maternal Grandfather        prostate  . Aortic aneurysm Maternal Grandfather   .  Stroke Paternal Grandmother   . Aortic aneurysm Paternal Grandfather     Social History Social History   Tobacco Use  . Smoking status: Never Smoker  . Smokeless tobacco: Never Used  Substance Use Topics  . Alcohol use: Yes    Alcohol/week: 0.0 oz    Comment: occasional  . Drug use: No     Allergies   Patient has no known allergies.   Review of Systems Review of Systems  Constitutional: Positive for chills. Negative for diaphoresis and fever.  Gastrointestinal: Positive for diarrhea. Negative for vomiting.  Musculoskeletal: Negative for arthralgias and myalgias.  Neurological: Negative for headaches.     Physical Exam Triage Vital Signs ED Triage Vitals [12/14/17 1400]  Enc Vitals Group     BP (!) 100/41     Pulse Rate 86     Resp 16     Temp 98.1 F (36.7 C)     Temp Source Oral     SpO2 100 %     Weight 118 lb (53.5 kg)      Height 5\' 3"  (1.6 m)     Head Circumference      Peak Flow      Pain Score 0     Pain Loc      Pain Edu?      Excl. in GC?    No data found.  Updated Vital Signs BP (!) 100/41 (BP Location: Right Arm)   Pulse 86   Temp 98.1 F (36.7 C) (Oral)   Resp 16   Ht 5\' 3"  (1.6 m)   Wt 118 lb (53.5 kg)   LMP 11/20/2017   SpO2 100%   BMI 20.90 kg/m   Visual Acuity Right Eye Distance:   Left Eye Distance:   Bilateral Distance:    Right Eye Near:   Left Eye Near:    Bilateral Near:     Physical Exam  Constitutional: She appears well-developed and well-nourished. No distress.  Abdominal: Soft. Bowel sounds are normal. She exhibits no distension and no mass. There is tenderness (mild, diffuse; no rebound or guarding). There is no rebound and no guarding.  Skin: She is not diaphoretic.  Nursing note and vitals reviewed.    UC Treatments / Results  Labs (all labs ordered are listed, but only abnormal results are displayed) Labs Reviewed  COMPREHENSIVE METABOLIC PANEL - Abnormal; Notable for the following components:      Result Value   Creatinine, Ser 1.12 (*)    GFR calc non Af Amer 58 (*)    All other components within normal limits  GASTROINTESTINAL PANEL BY PCR, STOOL (REPLACES STOOL CULTURE)  C DIFFICILE QUICK SCREEN W PCR REFLEX    EKG None Radiology Dg Abd 2 Views  Result Date: 12/14/2017 CLINICAL DATA:  One-week history of mid and lower abdominal pain associated with diarrhea. EXAM: ABDOMEN - 2 VIEW COMPARISON:  None. FINDINGS: Bowel gas pattern unremarkable without evidence of obstruction or significant ileus. No evidence of free air or significant air-fluid levels on the erect image. Expected stool burden in the colon. Slight lumbar levoscoliosis. No opaque urinary tract calculi. IMPRESSION: No acute abdominal abnormality. Electronically Signed   By: Hulan Saas M.D.   On: 12/14/2017 14:59    Procedures Procedures (including critical care  time)  Medications Ordered in UC Medications - No data to display   Initial Impression / Assessment and Plan / UC Course  I have reviewed the triage vital signs and the nursing notes.  Pertinent labs & imaging results that were available during my care of the patient were reviewed by me and considered in my medical decision making (see chart for details).       Final Clinical Impressions(s) / UC Diagnoses   Final diagnoses:  Diarrhea of infectious origin  (suspect possible C. Diff)  ED Discharge Orders        Ordered    vancomycin (VANCOCIN) 125 MG capsule  4 times daily     12/14/17 1514     1. Labs/x-ray results and diagnosis reviewed with patient 2. rx as per orders above; reviewed possible side effects, interactions, risks and benefits  3. Recommend supportive treatment with increase fluids 4, check stool sample for enteric pathogens, O&P 5. Follow-up prn if symptoms worsen or don't improve  Controlled Substance Prescriptions [place] Controlled Substance Registry consulted? Not Applicable   Payton Mccallum, MD 12/14/17 1743

## 2017-12-14 NOTE — Discharge Instructions (Signed)
Rest, fluids. 

## 2017-12-14 NOTE — ED Triage Notes (Signed)
Patient in today c/o diarrhea x 2 weeks. Patient ate at a restaurant and rash appeared on her buttocks within 1 hour of eating. Diarrhea started within 30 minutes of rash. Patient states that today she has had chills and flushing.  Patient does have a history of IBS.

## 2017-12-15 ENCOUNTER — Inpatient Hospital Stay: Admit: 2017-12-15 | Discharge: 2017-12-15 | Disposition: A | Discharge status: Home / Self Care | Payer: Self-pay

## 2017-12-15 LAB — GASTROINTESTINAL PANEL BY PCR, STOOL (REPLACES STOOL CULTURE)

## 2017-12-15 LAB — C DIFFICILE QUICK SCREEN W PCR REFLEX
C Diff antigen: NEGATIVE
C Diff interpretation: NOT DETECTED
C Diff toxin: NEGATIVE

## 2017-12-19 DIAGNOSIS — K591 Functional diarrhea: Secondary | ICD-10-CM | POA: Diagnosis not present

## 2018-01-23 ENCOUNTER — Other Ambulatory Visit: Payer: Self-pay | Admitting: Family Medicine

## 2018-01-23 DIAGNOSIS — Z1231 Encounter for screening mammogram for malignant neoplasm of breast: Secondary | ICD-10-CM

## 2018-02-27 ENCOUNTER — Ambulatory Visit
Admission: RE | Admit: 2018-02-27 | Discharge: 2018-02-27 | Disposition: A | Discharge status: Home / Self Care | Payer: BLUE CROSS/BLUE SHIELD | Source: Ambulatory Visit | Attending: Family Medicine | Admitting: Family Medicine

## 2018-02-27 DIAGNOSIS — Z1231 Encounter for screening mammogram for malignant neoplasm of breast: Secondary | ICD-10-CM

## 2018-04-17 ENCOUNTER — Ambulatory Visit: Payer: BLUE CROSS/BLUE SHIELD | Admitting: Obstetrics and Gynecology

## 2018-04-17 ENCOUNTER — Encounter: Payer: Self-pay | Admitting: Obstetrics and Gynecology

## 2018-04-17 ENCOUNTER — Ambulatory Visit (INDEPENDENT_AMBULATORY_CARE_PROVIDER_SITE_OTHER): Payer: BLUE CROSS/BLUE SHIELD

## 2018-04-17 VITALS — BP 110/70 | HR 90 | Ht 63.0 in | Wt 134.0 lb

## 2018-04-17 DIAGNOSIS — N888 Other specified noninflammatory disorders of cervix uteri: Secondary | ICD-10-CM | POA: Diagnosis not present

## 2018-04-17 DIAGNOSIS — N914 Secondary oligomenorrhea: Secondary | ICD-10-CM | POA: Diagnosis not present

## 2018-04-17 DIAGNOSIS — Z3009 Encounter for other general counseling and advice on contraception: Secondary | ICD-10-CM | POA: Diagnosis not present

## 2018-04-17 DIAGNOSIS — R1032 Left lower quadrant pain: Secondary | ICD-10-CM | POA: Diagnosis not present

## 2018-04-17 DIAGNOSIS — Z3202 Encounter for pregnancy test, result negative: Secondary | ICD-10-CM | POA: Diagnosis not present

## 2018-04-17 LAB — POCT URINALYSIS DIPSTICK
Bilirubin, UA: NEGATIVE
Blood, UA: NEGATIVE
Glucose, UA: NEGATIVE
KETONES UA: NEGATIVE
NITRITE UA: NEGATIVE
PH UA: 7.5 (ref 5.0–8.0)
PROTEIN UA: NEGATIVE
SPEC GRAV UA: 1.01 (ref 1.010–1.025)

## 2018-04-17 LAB — POCT URINE PREGNANCY: Preg Test, Ur: NEGATIVE

## 2018-04-17 NOTE — Patient Instructions (Signed)
I value your feedback and entrusting us with your care. If you get a [facility] patient survey, I would appreciate you taking the time to let us know about your experience today. Thank you! 

## 2018-04-17 NOTE — Progress Notes (Signed)
Lorie PhenixMaloney, Nancy, MD   Chief Complaint  Patient presents with  . Dysmenorrhea    sharp pain feels like left ovarian is twisting per pt    HPI:      [name] is a 47 y.o. [id number] who LMP was Patient's last menstrual period was 03/01/2018 (exact date)., presents today for LLQ pain and oligomenorrhea since June. Menses are usally Q3-4 wks, lasting 6 days. Her LMP was 6/19 and she hasn't had a period since. She had a little pink vag d/c, but none now. She feels lethargic, dizzy, with breast tenderness and feels like she needs a period. She has never skipped before. Neg home UPT. LLQ pain feels like a twisting, radiating down her leg. Hx of ovar cystectomies in past and ovar cysts that resolved on their own.  She is sex active, no dyspareunia. Partner had vasectomy years ago but never had confirmatory testing, so [facility], too.  No vag sx, GI sx. No fevers. Had UTI sx a couple wks ago that improved.  Last annual 11/18.  Past Medical History:  Diagnosis Date  . Bipolar disorder (HCC)   . Fibromyalgia   . Irritable bowel syndrome (IBS)     Past Surgical History:  Procedure Laterality Date  . AUGMENTATION MAMMAPLASTY  09/04/2008  . BREAST ENHANCEMENT SURGERY  2010  . laproscropic ovarion cyst  1999  . WISDOM TOOTH EXTRACTION  1995    Family History  Problem Relation Age of Onset  . Diabetes Mother        non-insulin dependent  . ALS Maternal Grandmother   . Cancer Maternal Grandfather        prostate  . Aortic aneurysm Maternal Grandfather   . Stroke Paternal Grandmother   . Aortic aneurysm Paternal Grandfather     Social History   Socioeconomic History  . Marital status: Divorced    Spouse name: Not on file  . Number of children: Not on file  . Years of education: Not on file  . Highest education level: Not on file  Occupational History  . Not on file  Social Needs  . Financial resource strain: Not on file  . Food insecurity:   Worry: Not on file    Inability: Not on file  . Transportation needs:    Medical: Not on file    Non-medical: Not on file  Tobacco Use  . Smoking status: Never Smoker  . Smokeless tobacco: Never Used  Substance and Sexual Activity  . Alcohol use: Yes    Alcohol/week: 0.0 standard drinks    Comment: occasional  . Drug use: No  . Sexual activity: Yes    Birth control/protection: Surgical    Comment: thoughts of vasectomy  Lifestyle  . Physical activity:    Days per week: Not on file    Minutes per session: Not on file  . Stress: Not on file  Relationships  . Social connections:    Talks on phone: Not on file    Gets together: Not on file    Attends religious service: Not on file    Active member of club or organization: Not on file    Attends meetings of clubs or organizations: Not on file    Relationship status: Not on file  . Intimate partner violence:    Fear of current or ex partner: Not on file    Emotionally abused: Not on file    Physically abused: Not on  file    Forced sexual activity: Not on file  Other Topics Concern  . Not on file  Social History Narrative  . Not on file    Outpatient Medications Prior to Visit  Medication Sig Dispense Refill  . lamoTRIgine (LAMICTAL) 200 MG tablet Take 200 mg by mouth at bedtime.  4  . QUEtiapine Fumarate (SEROQUEL XR) 150 MG 24 hr tablet     . albuterol (PROVENTIL HFA;VENTOLIN HFA) 108 (90 Base) MCG/ACT inhaler Inhale 2 puffs into the lungs every 6 (six) hours as needed for wheezing or shortness of breath. 1 Inhaler 2  . diclofenac (VOLTAREN) 75 MG EC tablet Take 75 mg by mouth 2 (two) times daily with a meal.  1  . fluconazole (DIFLUCAN) 150 MG tablet Take one tablet PO once; if needed repeat in 24 hours; may repeat a third time in 72 hours following second tab if needed (Patient not taking: Reported on 07/11/2017) 3 tablet 0  . promethazine-dextromethorphan (PROMETHAZINE-DM) 6.25-15 MG/5ML syrup Take 5 mLs by mouth at  bedtime as needed for cough. 118 mL 0  . SEROQUEL XR 200 MG 24 hr tablet Take 200 mg by mouth at bedtime.  4  . tiZANidine (ZANAFLEX) 4 MG tablet TAKE 0.5-1 TABLET BY MOUTH 3 TIMES A DAY AS NEEDED FOR SPASMS  1  . vancomycin (VANCOCIN) 125 MG capsule Take 1 capsule (125 mg total) by mouth 4 (four) times daily. 40 capsule 0   No facility-administered medications prior to visit.     ROS:  Review of Systems  Constitutional: Negative for fatigue, fever and unexpected weight change.  Respiratory: Negative for cough, shortness of breath and wheezing.   Cardiovascular: Negative for chest pain, palpitations and leg swelling.  Gastrointestinal: Negative for blood in stool, constipation, diarrhea, nausea and vomiting.  Endocrine: Negative for cold intolerance, heat intolerance and polyuria.  Genitourinary: Positive for menstrual problem and pelvic pain. Negative for dyspareunia, dysuria, flank pain, frequency, genital sores, hematuria, urgency, vaginal bleeding, vaginal discharge and vaginal pain.  Musculoskeletal: Negative for back pain, joint swelling and myalgias.  Skin: Negative for rash.  Neurological: Negative for dizziness, syncope, light-headedness, numbness and headaches.  Hematological: Negative for adenopathy.  Psychiatric/Behavioral: Negative for agitation, confusion, sleep disturbance and suicidal ideas. The patient is not nervous/anxious.     OBJECTIVE:   Vitals:  BP 110/70   Pulse 90   Ht 5\' 3"  (1.6 m)   Wt 134 lb (60.8 kg)   LMP 03/01/2018 (Exact Date)   BMI 23.74 kg/m   Physical Exam  Constitutional: She is oriented to person, place, and time. Vital signs are normal. She appears well-developed.  Neck: Normal range of motion.  Pulmonary/Chest: Effort normal.  Abdominal: Soft. She exhibits no distension. There is no tenderness. There is no guarding.  Genitourinary: Vagina normal and uterus normal. There is no rash, tenderness or lesion on the right labia. There is no  rash, tenderness or lesion on the left labia. Uterus is not enlarged and not tender. Cervix exhibits no motion tenderness. Right adnexum displays no mass and no tenderness. Left adnexum displays tenderness. Left adnexum displays no mass. No erythema or tenderness in the vagina. No vaginal discharge found.  Musculoskeletal: Normal range of motion.  Neurological: She is alert and oriented to person, place, and time.  Psychiatric: She has a normal mood and affect. Her behavior is normal. Thought content normal.  Vitals reviewed.   Results: Results for orders placed or performed in visit on 04/17/18 (from the  past 24 hour(s))  POCT Urinalysis Dipstick     Status: Abnormal   Collection Time: 04/17/18  3:06 PM  Result Value Ref Range   Color, UA straw    Clarity, UA clear    Glucose, UA Negative Negative   Bilirubin, UA neg    Ketones, UA neg    Spec Grav, UA 1.010 1.010 - 1.025   Blood, UA neg    pH, UA 7.5 5.0 - 8.0   Protein, UA Negative Negative   Urobilinogen, UA     Nitrite, UA neg    Leukocytes, UA Small (1+) (A) Negative   Appearance     Odor    POCT urine pregnancy     Status: Normal   Collection Time: 04/17/18  3:06 PM  Result Value Ref Range   Preg Test, Ur Negative Negative     Assessment/Plan: LLQ pain - Minimally tender on exam. Check u/s. Will call with results.  - Plan: US PELVIS TRANSVANGINAL NON-OB (TV ONLY), POCT Urinalysis Dipstick, Urine Culture  Secondary oligomenorrhea - Neg UPT. Chck u/s. Most likely perimenopausal sx. Will f/u with results.  - Plan: POCT urine pregnancy  Encounter for other general counseling or advice on contraception - Pt's partner going to get post-vasectomy clearance with urology. If abn, will discuss BC options, including OCPs and IUD.    Return in about 1 day (around 04/18/2018) for GYN u/s for LLQ pain--[facility] to call pt.  Alicia B. Copland, PA-C 04/17/2018 3:10 PM

## 2018-04-18 ENCOUNTER — Telehealth: Payer: Self-pay | Admitting: Obstetrics and Gynecology

## 2018-04-18 NOTE — Telephone Encounter (Signed)
Pt aware of GYN u/s results. Question endocx polyp but no DUB sx. Pt with oligomenorrhea, most likely perimenopause. F/u if no menses by 9/19 for labs/provera to see if withdrawal bleed helps pt feel better. F/u prn.

## 2018-04-19 LAB — URINE CULTURE

## 2018-05-15 ENCOUNTER — Ambulatory Visit: Payer: BLUE CROSS/BLUE SHIELD | Admitting: Family Medicine

## 2018-05-15 ENCOUNTER — Encounter: Payer: Self-pay | Admitting: Family Medicine

## 2018-05-15 VITALS — BP 104/66 | HR 84 | Temp 98.6°F | Wt 124.4 lb

## 2018-05-15 DIAGNOSIS — R300 Dysuria: Secondary | ICD-10-CM

## 2018-05-15 LAB — POCT URINALYSIS DIPSTICK
Bilirubin, UA: NEGATIVE
Blood, UA: NEGATIVE
Glucose, UA: NEGATIVE
Ketones, UA: NEGATIVE
LEUKOCYTES UA: NEGATIVE
NITRITE UA: NEGATIVE
PROTEIN UA: NEGATIVE
Spec Grav, UA: <=1.005 — AB (ref 1.010–1.025)
Urobilinogen, UA: 0.2 E.U./dL
pH, UA: 7 (ref 5.0–8.0)

## 2018-05-15 NOTE — Progress Notes (Signed)
Patient: BLANCHE KIMMINS Female    DOB: [birth year]   47 y.o.   MRN: [id number] Visit Date: 05/15/2018  Today's Provider: Shirlee Latch, MD   Chief Complaint  Patient presents with  . Urinary Tract Infection   Subjective:    I, Presley Raddle, CMA, am acting as a scribe for Shirlee Latch, MD.   Urinary Tract Infection   This is a new problem. Episode onset: couple weeks ago. The problem has been waxing and waning. Quality: pressure at the end of urinating. Associated symptoms include frequency and urgency. Pertinent negatives include no chills, discharge, flank pain or sweats. Associated symptoms comments: Back pain . She has tried NSAIDs for the symptoms. The treatment provided no relief. There is no history of kidney stones or recurrent UTIs.   Burning with urination comes at end of stream.  It has been worse in the past few days.  UA at GYN ~1 month ago showed small leuks, but UCx negative.  She does drink a lot of caffeine.   No Known Allergies   Current Outpatient Medications:  .  lamoTRIgine (LAMICTAL) 200 MG tablet, Take 200 mg by mouth at bedtime., Disp: , Rfl: 4 .  QUEtiapine Fumarate (SEROQUEL XR) 150 MG 24 hr tablet, , Disp: , Rfl:   Review of Systems  Constitutional: Negative.  Negative for chills.  HENT: Negative.   Respiratory: Negative.   Cardiovascular: Negative.   Gastrointestinal: Negative.   Genitourinary: Positive for dysuria, frequency and urgency. Negative for decreased urine volume, flank pain, pelvic pain, vaginal bleeding, vaginal discharge and vaginal pain.  Musculoskeletal: Negative.   Neurological: Negative.     Social History   Tobacco Use  . Smoking status: Never Smoker  . Smokeless tobacco: Never Used  Substance Use Topics  . Alcohol use: Yes    Alcohol/week: 0.0 standard drinks    Comment: occasional   Objective:   BP 104/66 (BP Location: Left Arm, Patient Position: Sitting, Cuff Size: Normal)   Pulse 84   Temp 98.6 F (37 C)  (Oral)   Wt 124 lb 6.4 oz (56.4 kg)   SpO2 99%   BMI 22.04 kg/m  Vitals:   05/15/18 1454  BP: 104/66  Pulse: 84  Temp: 98.6 F (37 C)  TempSrc: Oral  SpO2: 99%  Weight: 124 lb 6.4 oz (56.4 kg)     Physical Exam  Constitutional: She is oriented to person, place, and time. She appears well-developed and well-nourished. No distress.  HENT:  Head: Normocephalic and atraumatic.  Eyes: Conjunctivae are normal. No scleral icterus.  Cardiovascular: Normal rate, regular rhythm, normal heart sounds and intact distal pulses.  No murmur heard. Pulmonary/Chest: Effort normal and breath sounds normal. No respiratory distress. She has no wheezes. She has no rales.  Abdominal: Soft. She exhibits no distension. There is no tenderness. There is no rebound, no guarding and no CVA tenderness.  Musculoskeletal: She exhibits no edema.  Neurological: She is alert and oriented to person, place, and time.  Skin: Skin is warm and dry. Capillary refill takes less than 2 seconds. No rash noted.  Psychiatric: She has a normal mood and affect. Her behavior is normal.  Vitals reviewed.    Results for orders placed or performed in visit on 05/15/18  POCT Urinalysis Dipstick  Result Value Ref Range   Color, UA dark yellow    Clarity, UA clear    Glucose, UA Negative Negative   Bilirubin, UA negative    Ketones, UA  negative    Spec Grav, UA <=1.005 (A) 1.010 - 1.025   Blood, UA negative    pH, UA 7.0 5.0 - 8.0   Protein, UA Negative Negative   Urobilinogen, UA 0.2 0.2 or 1.0 E.U./dL   Nitrite, UA negative    Leukocytes, UA Negative Negative   Appearance     Odor         Assessment & Plan:   1. Dysuria - UA not consistent with UTI and recent urine culture negative - as symptoms have worsened since last UCx, will repeat today - no empiric treatment at this time - suspect this is more likely related to bladder irritation from caffeine intake - discussed hydration, AZO, and cranberry juice -  return precautions discussed - POCT Urinalysis Dipstick - Urine Culture   Return in about 4 weeks (around 06/12/2018) for Physical.   The entirety of the information documented in the History of Present Illness, Review of Systems and Physical Exam were personally obtained by me. Portions of this information were initially documented by Presley Raddle, CMA and reviewed by me for thoroughness and accuracy.    Erasmo Downer, MD, MPH [facility] 05/15/2018 3:44 PM

## 2018-05-15 NOTE — Patient Instructions (Signed)
Cranberry juice Hydration AZO  Dysuria Dysuria is pain or discomfort while urinating. The pain or discomfort may be felt in the tube that carries urine out of the bladder (urethra) or in the surrounding tissue of the genitals. The pain may also be felt in the groin area, lower abdomen, and lower back. You may have to urinate frequently or have the sudden feeling that you have to urinate (urgency). Dysuria can affect both men and women, but is more common in women. Dysuria can be caused by many different things, including:  Urinary tract infection in women.  Infection of the kidney or bladder.  Kidney stones or bladder stones.  Certain sexually transmitted infections (STIs), such as chlamydia.  Dehydration.  Inflammation of the vagina.  Use of certain medicines.  Use of certain soaps or scented products that cause irritation.  Follow these instructions at home: Watch your dysuria for any changes. The following actions may help to reduce any discomfort you are feeling:  Drink enough fluid to keep your urine clear or pale yellow.  Empty your bladder often. Avoid holding urine for long periods of time.  After a bowel movement or urination, women should cleanse from front to back, using each tissue only once.  Empty your bladder after sexual intercourse.  Take medicines only as directed by your health care provider.  If you were prescribed an antibiotic medicine, finish it all even if you start to feel better.  Avoid caffeine, tea, and alcohol. They can irritate the bladder and make dysuria worse. In men, alcohol may irritate the prostate.  Keep all follow-up visits as directed by your health care provider. This is important.  If you had any tests done to find the cause of dysuria, it is your responsibility to obtain your test results. Ask the lab or department performing the test when and how you will get your results. Talk with your health care provider if you have any  questions about your results.  Contact a health care provider if:  You develop pain in your back or sides.  You have a fever.  You have nausea or vomiting.  You have blood in your urine.  You are not urinating as often as you usually do. Get help right away if:  You pain is severe and not relieved with medicines.  You are unable to hold down any fluids.  You or someone else notices a change in your mental function.  You have a rapid heartbeat at rest.  You have shaking or chills.  You feel extremely weak. This information is not intended to replace advice given to you by your health care provider. Make sure you discuss any questions you have with your health care provider. Document Released: 05/19/2004 Document Revised: 01/27/2016 Document Reviewed: 04/16/2014 Elsevier Interactive Patient Education  [employer].

## 2018-05-17 ENCOUNTER — Telehealth: Payer: Self-pay

## 2018-05-17 DIAGNOSIS — R300 Dysuria: Secondary | ICD-10-CM

## 2018-05-17 LAB — URINE CULTURE

## 2018-05-17 MED ORDER — SULFAMETHOXAZOLE-TRIMETHOPRIM 800-160 MG PO TABS: 1.0000 | ORAL_TABLET | Freq: Two times a day (BID) | ORAL | 0 refills | Status: DC

## 2018-05-17 NOTE — Telephone Encounter (Signed)
-----   Message from Erasmo DownerAngela M Bacigalupo, MD sent at 05/17/2018 11:40 AM EDT ----- Urine culture does show UTI caused by Klebsiella.  Will treat with 5 day course of Bactrim BID. Please send Rx Bactrim DS 1 tab BID x5 days #10 r0 and let patient know.  Thanks!  Erasmo DownerBacigalupo, Angela M, MD, MPH [facility] 05/17/2018 11:40 AM

## 2018-05-17 NOTE — Telephone Encounter (Signed)
Patient advised as below. Patient verbalizes understanding and is in agreement with treatment plan.  

## 2018-05-29 DIAGNOSIS — F3181 Bipolar II disorder: Secondary | ICD-10-CM | POA: Diagnosis not present

## 2018-05-29 DIAGNOSIS — F419 Anxiety disorder, unspecified: Secondary | ICD-10-CM | POA: Diagnosis not present

## 2018-05-30 ENCOUNTER — Encounter: Payer: Self-pay | Admitting: Family Medicine

## 2018-05-30 ENCOUNTER — Ambulatory Visit (INDEPENDENT_AMBULATORY_CARE_PROVIDER_SITE_OTHER): Payer: BLUE CROSS/BLUE SHIELD | Admitting: Family Medicine

## 2018-05-30 VITALS — BP 102/64 | HR 80 | Temp 98.5°F | Ht 63.0 in | Wt 125.0 lb

## 2018-05-30 DIAGNOSIS — R5382 Chronic fatigue, unspecified: Secondary | ICD-10-CM | POA: Diagnosis not present

## 2018-05-30 DIAGNOSIS — Z114 Encounter for screening for human immunodeficiency virus [HIV]: Secondary | ICD-10-CM | POA: Diagnosis not present

## 2018-05-30 DIAGNOSIS — Z0000 Encounter for general adult medical examination without abnormal findings: Secondary | ICD-10-CM

## 2018-05-30 DIAGNOSIS — N921 Excessive and frequent menstruation with irregular cycle: Secondary | ICD-10-CM | POA: Diagnosis not present

## 2018-05-30 DIAGNOSIS — Z23 Encounter for immunization: Secondary | ICD-10-CM

## 2018-05-30 NOTE — Patient Instructions (Signed)
Preventive Care 40-64 Years, Female Preventive care refers to lifestyle choices and visits with your health care provider that can promote health and wellness. What does preventive care include?  A yearly physical exam. This is also called an annual well check.  Dental exams once or twice a year.  Routine eye exams. Ask your health care provider how often you should have your eyes checked.  Personal lifestyle choices, including: ? Daily care of your teeth and gums. ? Regular physical activity. ? Eating a healthy diet. ? Avoiding tobacco and drug use. ? Limiting alcohol use. ? Practicing safe sex. ? Taking low-dose aspirin daily starting at age 58. ? Taking vitamin and mineral supplements as recommended by your health care provider. What happens during an annual well check? The services and screenings done by your health care provider during your annual well check will depend on your age, overall health, lifestyle risk factors, and family history of disease. Counseling Your health care provider may ask you questions about your:  Alcohol use.  Tobacco use.  Drug use.  Emotional well-being.  Home and relationship well-being.  Sexual activity.  Eating habits.  Work and work Statistician.  Method of birth control.  Menstrual cycle.  Pregnancy history.  Screening You may have the following tests or measurements:  Height, weight, and BMI.  Blood pressure.  Lipid and cholesterol levels. These may be checked every 5 years, or more frequently if you are over 81 years old.  Skin check.  Lung cancer screening. You may have this screening every year starting at age 78 if you have a 30-pack-year history of smoking and currently smoke or have quit within the past 15 years.  Fecal occult blood test (FOBT) of the stool. You may have this test every year starting at age 65.  Flexible sigmoidoscopy or colonoscopy. You may have a sigmoidoscopy every 5 years or a colonoscopy  every 10 years starting at age 30.  Hepatitis C blood test.  Hepatitis B blood test.  Sexually transmitted disease (STD) testing.  Diabetes screening. This is done by checking your blood sugar (glucose) after you have not eaten for a while (fasting). You may have this done every 1-3 years.  Mammogram. This may be done every 1-2 years. Talk to your health care provider about when you should start having regular mammograms. This may depend on whether you have a family history of breast cancer.  BRCA-related cancer screening. This may be done if you have a family history of breast, ovarian, tubal, or peritoneal cancers.  Pelvic exam and Pap test. This may be done every 3 years starting at age 80. Starting at age 36, this may be done every 5 years if you have a Pap test in combination with an HPV test.  Bone density scan. This is done to screen for osteoporosis. You may have this scan if you are at high risk for osteoporosis.  Discuss your test results, treatment options, and if necessary, the need for more tests with your health care provider. Vaccines Your health care provider may recommend certain vaccines, such as:  Influenza vaccine. This is recommended every year.  Tetanus, diphtheria, and acellular pertussis (Tdap, Td) vaccine. You may need a Td booster every 10 years.  Varicella vaccine. You may need this if you have not been vaccinated.  Zoster vaccine. You may need this after age 5.  Measles, mumps, and rubella (MMR) vaccine. You may need at least one dose of MMR if you were born in  1957 or later. You may also need a second dose.  Pneumococcal 13-valent conjugate (PCV13) vaccine. You may need this if you have certain conditions and were not previously vaccinated.  Pneumococcal polysaccharide (PPSV23) vaccine. You may need one or two doses if you smoke cigarettes or if you have certain conditions.  Meningococcal vaccine. You may need this if you have certain  conditions.  Hepatitis A vaccine. You may need this if you have certain conditions or if you travel or work in places where you may be exposed to hepatitis A.  Hepatitis B vaccine. You may need this if you have certain conditions or if you travel or work in places where you may be exposed to hepatitis B.  Haemophilus influenzae type b (Hib) vaccine. You may need this if you have certain conditions.  Talk to your health care provider about which screenings and vaccines you need and how often you need them. This information is not intended to replace advice given to you by your health care provider. Make sure you discuss any questions you have with your health care provider. Document Released: 09/17/2015 Document Revised: 05/10/2016 Document Reviewed: 06/22/2015 Elsevier Interactive Patient Education  2018 Elsevier Inc.  

## 2018-05-30 NOTE — Progress Notes (Signed)
Patient: Tanya Cochran, Female    DOB: [birth year], 47 y.o.   MRN: [id number] Visit Date: 05/30/2018  Today's Provider: Shirlee Latch, MD   Chief Complaint  Patient presents with  . Annual Exam   Subjective:  I, Presley Raddle, CMA, am acting as a scribe for Shirlee Latch, MD.    Annual physical exam Tanya Cochran is a 47 y.o. female who presents today for health maintenance and complete physical. She feels fairly well. She reports exercising none. She reports she is sleeping well.  She wonders if she is getting close to menopause as she is having irregular cycles and heavy periods.  She is following with GYN and would like LH/FSH checked today. She is feeling very fatigued from this ----------------------------------------------------------------- Pap: 07/11/2017 Mammogram: 02/27/2018   Review of Systems  Constitutional: Positive for fatigue.  HENT: Negative.   Eyes: Negative.   Respiratory: Negative.   Cardiovascular: Negative.   Gastrointestinal: Negative.   Endocrine: Negative.   Genitourinary: Negative.   Musculoskeletal: Positive for arthralgias and back pain.  Skin: Negative.   Allergic/Immunologic: Positive for environmental allergies.  Neurological: Negative.   Hematological: Negative.   Psychiatric/Behavioral: The patient is nervous/anxious.     Social History      She  reports that she has never smoked. She has never used smokeless tobacco. She reports that she drinks alcohol. She reports that she does not use drugs.       Social History   Socioeconomic History  . Marital status: Divorced    Spouse name: Not on file  . Number of children: Not on file  . Years of education: Not on file  . Highest education level: Not on file  Occupational History  . Not on file  Social Needs  . Financial resource strain: Not on file  . Food insecurity:    Worry: Not on file    Inability: Not on file  . Transportation needs:    Medical: Not on file   Non-medical: Not on file  Tobacco Use  . Smoking status: Never Smoker  . Smokeless tobacco: Never Used  Substance and Sexual Activity  . Alcohol use: Yes    Alcohol/week: 0.0 standard drinks    Comment: occasional  . Drug use: No  . Sexual activity: Yes    Birth control/protection: Surgical    Comment: thoughts of vasectomy  Lifestyle  . Physical activity:    Days per week: Not on file    Minutes per session: Not on file  . Stress: Not on file  Relationships  . Social connections:    Talks on phone: Not on file    Gets together: Not on file    Attends religious service: Not on file    Active member of club or organization: Not on file    Attends meetings of clubs or organizations: Not on file    Relationship status: Not on file  Other Topics Concern  . Not on file  Social History Narrative  . Not on file    Past Medical History:  Diagnosis Date  . Bipolar disorder (HCC)   . Fibromyalgia   . Irritable bowel syndrome (IBS)      Patient Active Problem List   Diagnosis Date Noted  . Irritable bowel syndrome 06/30/2015  . Fibromyalgia 06/30/2015  . H/O manic depressive disorder 06/30/2015    Past Surgical History:  Procedure Laterality Date  . AUGMENTATION MAMMAPLASTY  09/04/2008  . BREAST ENHANCEMENT SURGERY  2010  . laproscropic ovarion cyst  1999  . WISDOM TOOTH EXTRACTION  1995    Family History        Family Status  Relation Name Status  . Mother  Alive  . Father  Alive  . Brother  Deceased  . MGM  Deceased  . MGF  Deceased  . PGM  Deceased  . PGF  Deceased        Her family history includes ALS in her maternal grandmother; Aortic aneurysm in her maternal grandfather and paternal grandfather; Cancer in her maternal grandfather; Diabetes in her mother; Stroke in her paternal grandmother.      No Known Allergies   Current Outpatient Medications:  .  lamoTRIgine (LAMICTAL) 200 MG tablet, Take 200 mg by mouth at bedtime., Disp: , Rfl: 4 .   QUEtiapine Fumarate (SEROQUEL XR) 150 MG 24 hr tablet, , Disp: , Rfl:    Patient Care Team: Erasmo Downer, MD as PCP - General (Family Medicine)      Objective:   Vitals: BP 102/64 (BP Location: Right Arm, Patient Position: Sitting, Cuff Size: Normal)   Pulse 80   Temp 98.5 F (36.9 C) (Oral)   Ht 5\' 3"  (1.6 m)   Wt 125 lb (56.7 kg)   SpO2 98%   BMI 22.14 kg/m    Vitals:   05/30/18 1147  BP: 102/64  Pulse: 80  Temp: 98.5 F (36.9 C)  TempSrc: Oral  SpO2: 98%  Weight: 125 lb (56.7 kg)  Height: 5\' 3"  (1.6 m)     Physical Exam  Constitutional: She is oriented to person, place, and time. She appears well-developed and well-nourished. No distress.  HENT:  Head: Normocephalic and atraumatic.  Right Ear: External ear normal.  Left Ear: External ear normal.  Nose: Nose normal.  Mouth/Throat: Oropharynx is clear and moist.  Eyes: Pupils are equal, round, and reactive to light. Conjunctivae and EOM are normal. No scleral icterus.  Neck: Neck supple. No thyromegaly present.  Cardiovascular: Normal rate, regular rhythm, normal heart sounds and intact distal pulses.  No murmur heard. Pulmonary/Chest: Effort normal and breath sounds normal. No respiratory distress. She has no wheezes. She has no rales.  Abdominal: Soft. Bowel sounds are normal. She exhibits no distension. There is no tenderness. There is no rebound and no guarding.  Musculoskeletal: She exhibits no edema or deformity.  Lymphadenopathy:    She has no cervical adenopathy.  Neurological: She is alert and oriented to person, place, and time.  Skin: Skin is warm and dry. Capillary refill takes less than 2 seconds. No rash noted.  Psychiatric: She has a normal mood and affect. Her behavior is normal.  Vitals reviewed.    Depression Screen PHQ 2/9 Scores 05/30/2018  PHQ - 2 Score 1  PHQ- 9 Score 4      Assessment & Plan:     Routine Health Maintenance and Physical Exam  Exercise Activities and  Dietary recommendations Goals   None     Immunization History  Administered Date(s) Administered  . Influenza,inj,Quad PF,6+ Mos 05/30/2018  . Tdap 05/30/2018    Health Maintenance  Topic Date Due  . HIV Screening  05/27/1986  . PAP SMEAR  07/11/2020  . TETANUS/TDAP  05/30/2028  . INFLUENZA VACCINE  Completed     Discussed health benefits of physical activity, and encouraged her to engage in regular exercise appropriate for her age and condition.    --------------------------------------------------------------------  Problem List Items Addressed This Visit  None    Visit Diagnoses    Encounter for annual physical exam    -  Primary   Relevant Orders   Lipid Profile   HIV Antibody (routine testing w rflx)   CBC   TSH   FSH/LH   Comprehensive metabolic panel   Chronic fatigue       Relevant Orders   CBC   TSH   Comprehensive metabolic panel   Menorrhagia with irregular cycle       Relevant Orders   CBC   TSH   FSH/LH   Screening for HIV (human immunodeficiency virus)       Relevant Orders   HIV Antibody (routine testing w rflx)       Return in about 1 year (around 05/31/2019) for CPE.   The entirety of the information documented in the History of Present Illness, Review of Systems and Physical Exam were personally obtained by me. Portions of this information were initially documented by Presley Raddle, CMA and reviewed by me for thoroughness and accuracy.    Erasmo Downer, MD, MPH [facility] 05/30/2018 2:13 PM

## 2018-06-06 DIAGNOSIS — Z0000 Encounter for general adult medical examination without abnormal findings: Secondary | ICD-10-CM | POA: Diagnosis not present

## 2018-06-06 DIAGNOSIS — N921 Excessive and frequent menstruation with irregular cycle: Secondary | ICD-10-CM | POA: Diagnosis not present

## 2018-06-06 DIAGNOSIS — Z114 Encounter for screening for human immunodeficiency virus [HIV]: Secondary | ICD-10-CM | POA: Diagnosis not present

## 2018-06-06 DIAGNOSIS — R5382 Chronic fatigue, unspecified: Secondary | ICD-10-CM | POA: Diagnosis not present

## 2018-06-07 LAB — LIPID PANEL
CHOL/HDL RATIO: 3.4 ratio (ref 0.0–4.4)
CHOLESTEROL TOTAL: 199 mg/dL (ref 100–199)
HDL: 59 mg/dL (ref >39)
LDL CALC: 128 mg/dL — AB (ref 0–99)
Triglycerides: 62 mg/dL (ref 0–149)
VLDL CHOLESTEROL CAL: 12 mg/dL (ref 5–40)

## 2018-06-07 LAB — COMPREHENSIVE METABOLIC PANEL
A/G RATIO: 1.7 (ref 1.2–2.2)
ALT: 13 IU/L (ref 0–32)
AST: 16 IU/L (ref 0–40)
Albumin: 4 g/dL (ref 3.5–5.5)
Alkaline Phosphatase: 48 IU/L (ref 39–117)
BUN/Creatinine Ratio: 14 (ref 9–23)
BUN: 13 mg/dL (ref 6–24)
Bilirubin Total: 0.5 mg/dL (ref 0.0–1.2)
CHLORIDE: 102 mmol/L (ref 96–106)
CO2: 25 mmol/L (ref 20–29)
Calcium: 9.2 mg/dL (ref 8.7–10.2)
Creatinine, Ser: 0.95 mg/dL (ref 0.57–1.00)
GFR calc non Af Amer: 72 mL/min/{1.73_m2} (ref >59)
GFR, EST AFRICAN AMERICAN: 82 mL/min/{1.73_m2} (ref >59)
Globulin, Total: 2.4 g/dL (ref 1.5–4.5)
Glucose: 84 mg/dL (ref 65–99)
POTASSIUM: 4 mmol/L (ref 3.5–5.2)
Sodium: 140 mmol/L (ref 134–144)
TOTAL PROTEIN: 6.4 g/dL (ref 6.0–8.5)

## 2018-06-07 LAB — HIV ANTIBODY (ROUTINE TESTING W REFLEX): HIV SCREEN 4TH GENERATION: NONREACTIVE

## 2018-06-07 LAB — CBC
Hematocrit: 42.5 % (ref 34.0–46.6)
Hemoglobin: 14.6 g/dL (ref 11.1–15.9)
MCH: 32.2 pg (ref 26.6–33.0)
MCHC: 34.4 g/dL (ref 31.5–35.7)
MCV: 94 fL (ref 79–97)
Platelets: 276 10*3/uL (ref 150–450)
RBC: 4.53 x10E6/uL (ref 3.77–5.28)
RDW: 12.7 % (ref 12.3–15.4)
WBC: 4.5 10*3/uL (ref 3.4–10.8)

## 2018-06-07 LAB — TSH: TSH: 3.19 u[IU]/mL (ref 0.450–4.500)

## 2018-06-07 LAB — FSH/LH
FSH: 10.9 m[IU]/mL
LH: 9.5 m[IU]/mL

## 2018-06-10 ENCOUNTER — Telehealth: Payer: Self-pay

## 2018-06-10 NOTE — Telephone Encounter (Signed)
LMTCB

## 2018-06-10 NOTE — Telephone Encounter (Signed)
-----   Message from Erasmo Downer, MD sent at 06/10/2018  8:49 AM EDT ----- Normal labs.  Not post-menopausal  Beryle Flock Marzella Schlein, MD, MPH [facility] 06/10/2018 8:49 AM

## 2018-06-11 NOTE — Telephone Encounter (Signed)
Pt advised.   Thanks,   -Laura  

## 2018-06-11 NOTE — Telephone Encounter (Signed)
Pt returned call. Please advise. Thanks TNP °

## 2019-02-24 ENCOUNTER — Other Ambulatory Visit: Payer: Self-pay | Admitting: Family Medicine

## 2019-02-24 DIAGNOSIS — Z1231 Encounter for screening mammogram for malignant neoplasm of breast: Secondary | ICD-10-CM

## 2019-03-05 ENCOUNTER — Telehealth: Payer: Self-pay

## 2019-03-05 NOTE — Telephone Encounter (Signed)
Patient called office stating that two days ago she was picking leaves outside her home and believes she may have been in contact with poison ivy/oak. Patient states that areas affected by rash are on her hands, face, neck and trunk. Patient states that she has been taking oral Benadryl and applying benadryl topical unto her skin with no relief. Patient states that she is unable to make a visit due to her work schedule and wanted to know if we can [facility] in [facility] [place]? Patient can be reached at [phone]. KW

## 2019-03-06 MED ORDER — PREDNISONE 10 MG PO TABS: ORAL_TABLET | ORAL | 0 refills | Status: DC

## 2019-03-06 NOTE — Telephone Encounter (Signed)
Actually [facility] [place]. I had missed that initially. Thanks!

## 2019-03-06 NOTE — Telephone Encounter (Signed)
Patient was advised and states that she is going to pick it up from [facility] [place].

## 2019-03-06 NOTE — Telephone Encounter (Signed)
As the rash seems to include face and neck, we would consider this a severe reaction.  We treat severe reactions with a long prednisone taper.  If the prednisone is stopped too early, the rash can flare back up, so he is almost 3 weeks of treatment.  I have sent this prednisone taper to [facility] [place].  She should take this as directed.  She should take the full dose in the morning each day with food to avoid insomnia or upset stomach.  Rash will slowly start to go away and it will improve.  She can continue taking Benadryl and using calamine lotion with this.  If she develops any signs of infection, like spreading redness or fever or if she develops lesions near her eyes, she should give [place] a call back.

## 2019-03-06 NOTE — Telephone Encounter (Signed)
Pt called back this morning asking if she can get something for the poison ivy  [facility]  CB#  [phone]  Thanks Con Memos

## 2019-04-08 DIAGNOSIS — F3181 Bipolar II disorder: Secondary | ICD-10-CM | POA: Diagnosis not present

## 2019-04-09 ENCOUNTER — Other Ambulatory Visit: Payer: Self-pay

## 2019-04-09 ENCOUNTER — Ambulatory Visit
Admission: RE | Admit: 2019-04-09 | Discharge: 2019-04-09 | Disposition: A | Discharge status: Home / Self Care | Payer: PRIVATE HEALTH INSURANCE | Source: Ambulatory Visit | Attending: Family Medicine | Admitting: Family Medicine

## 2019-04-09 DIAGNOSIS — Z1231 Encounter for screening mammogram for malignant neoplasm of breast: Secondary | ICD-10-CM

## 2019-06-03 NOTE — Progress Notes (Signed)
Patient: Tanya Cochran, Female    DOB: 26-Aug-1971, 48 y.o.   MRN: [id number] Visit Date: 06/04/2019  Today's Provider: Shirlee Latch, MD   Chief Complaint  Patient presents with  . Annual Exam   Subjective:  I, Porsha McClurkin CMA, am acting as a scribe for Shirlee Latch, MD.    Annual physical exam Tanya Cochran is a 48 y.o. female who presents today for health maintenance and complete physical. She feels well. She reports exercising none. She reports she is sleeping well.  Has episodes of fatigue, irregular menses, and intermittently uterine cramping  Occasional leg cramps in the evenings Better with mustard ----------------------------------------------------------------- Last mammogram:04/09/2019 - BIRADs1 Last Pap:07/11/2017 - NIL, HPV negative  Review of Systems  Constitutional: Positive for fatigue.  HENT: Negative.   Eyes: Negative.   Respiratory: Negative.   Cardiovascular: Negative.   Gastrointestinal: Positive for abdominal pain.  Endocrine: Negative.   Genitourinary: Negative.   Musculoskeletal: Negative.   Skin: Negative.   Allergic/Immunologic: Negative.   Neurological: Negative.   Hematological: Negative.   Psychiatric/Behavioral: Negative.     Social History She  reports that she has never smoked. She has never used smokeless tobacco. She reports current alcohol use. She reports that she does not use drugs. Social History   Socioeconomic History  . Marital status: Divorced    Spouse name: Not on file  . Number of children: Not on file  . Years of education: Not on file  . Highest education level: Not on file  Occupational History  . Not on file  Social Needs  . Financial resource strain: Not on file  . Food insecurity    Worry: Not on file    Inability: Not on file  . Transportation needs    Medical: Not on file    Non-medical: Not on file  Tobacco Use  . Smoking status: Never Smoker  . Smokeless tobacco: Never Used   Substance and Sexual Activity  . Alcohol use: Yes    Alcohol/week: 0.0 standard drinks    Comment: occasional  . Drug use: No  . Sexual activity: Yes    Birth control/protection: Surgical    Comment: thoughts of vasectomy  Lifestyle  . Physical activity    Days per week: Not on file    Minutes per session: Not on file  . Stress: Not on file  Relationships  . Social Musician on phone: Not on file    Gets together: Not on file    Attends religious service: Not on file    Active member of club or organization: Not on file    Attends meetings of clubs or organizations: Not on file    Relationship status: Not on file  Other Topics Concern  . Not on file  Social History Narrative  . Not on file    Patient Active Problem List   Diagnosis Date Noted  . Irritable bowel syndrome 06/30/2015  . Fibromyalgia 06/30/2015  . H/O manic depressive disorder 06/30/2015    Past Surgical History:  Procedure Laterality Date  . AUGMENTATION MAMMAPLASTY  09/04/2008  . BREAST ENHANCEMENT SURGERY  2010  . laproscropic ovarion cyst  1999  . WISDOM TOOTH EXTRACTION  1995    Family History  Family Status  Relation Name Status  . Mother  Alive  . Father  Alive  . Brother  Deceased  . MGM  Deceased  . MGF  Deceased  . PGM  Deceased  . PGF  Deceased   Her family history includes ALS in her maternal grandmother; Aortic aneurysm in her maternal grandfather and paternal grandfather; Cancer in her maternal grandfather; Diabetes in her mother; Stroke in her paternal grandmother.     No Known Allergies  Previous Medications   LAMOTRIGINE (LAMICTAL) 200 MG TABLET    Take 200 mg by mouth at bedtime.   PREDNISONE (DELTASONE) 10 MG TABLET    Take 40 mg PO daily x4d, then 30mg  daily x4d, then 20mg  daily x4d, then 10mg  daily x4d, then 5mg  daily x4d   QUETIAPINE FUMARATE (SEROQUEL XR) 150 MG 24 HR TABLET        Patient Care Team: Erasmo DownerBacigalupo, Ardean Simonich M, MD as PCP - General (Family  Medicine)      Objective:   Vitals: BP 109/74 (BP Location: Left Arm, Patient Position: Sitting, Cuff Size: Normal)   Pulse 78   Temp (!) 97.5 F (36.4 C) (Temporal)   Ht 5\' 3"  (1.6 m)   Wt 132 lb 3.2 oz (60 kg)   LMP 05/11/2019 (Exact Date)   SpO2 98%   BMI 23.42 kg/m    Physical Exam Vitals signs reviewed.  Constitutional:      General: She is not in acute distress.    Appearance: Normal appearance. She is well-developed. She is not diaphoretic.  HENT:     Head: Normocephalic and atraumatic.     Right Ear: Tympanic membrane, ear canal and external ear normal.     Left Ear: Tympanic membrane, ear canal and external ear normal.  Eyes:     General: No scleral icterus.    Conjunctiva/sclera: Conjunctivae normal.     Pupils: Pupils are equal, round, and reactive to light.  Neck:     Musculoskeletal: Neck supple.     Thyroid: No thyromegaly.  Cardiovascular:     Rate and Rhythm: Normal rate and regular rhythm.     Pulses: Normal pulses.     Heart sounds: Normal heart sounds. No murmur.  Pulmonary:     Effort: Pulmonary effort is normal. No respiratory distress.     Breath sounds: Normal breath sounds. No wheezing or rales.  Abdominal:     General: There is no distension.     Palpations: Abdomen is soft.     Tenderness: There is no abdominal tenderness.  Musculoskeletal:        General: No deformity.     Right lower leg: No edema.     Left lower leg: No edema.  Lymphadenopathy:     Cervical: No cervical adenopathy.  Skin:    General: Skin is warm and dry.     Capillary Refill: Capillary refill takes less than 2 seconds.     Findings: No rash.  Neurological:     Mental Status: She is alert and oriented to person, place, and time. Mental status is at baseline.  Psychiatric:        Mood and Affect: Mood normal.        Behavior: Behavior normal.        Thought Content: Thought content normal.     Depression Screen PHQ 2/9 Scores 05/30/2018  PHQ - 2 Score 1  PHQ-  9 Score 4    Assessment & Plan:     Routine Health Maintenance and Physical Exam  Exercise Activities and Dietary recommendations Goals   None     Immunization History  Administered Date(s) Administered  . Influenza,inj,Quad PF,6+ Mos 05/30/2018  . Tdap 05/30/2018  Health Maintenance  Topic Date Due  . INFLUENZA VACCINE  04/05/2019  . PAP SMEAR-Modifier  07/11/2022  . TETANUS/TDAP  05/30/2028  . HIV Screening  Completed     Discussed health benefits of physical activity, and encouraged her to engage in regular exercise appropriate for her age and condition.    --------------------------------------------------------------------  Problem List Items Addressed This Visit      Other   Avitaminosis D    Recheck Vit D level Not currently on supplement      Relevant Orders   VITAMIN D 25 Hydroxy (Vit-D Deficiency, Fractures)   Chronic fatigue    Likely related to hormonal changes and perimenopause, but will check CBC, CMP, Vit D, Vit B12, TSH      Relevant Orders   CBC with Differential/Platelet   Comprehensive metabolic panel   TSH   E56   VITAMIN D 25 Hydroxy (Vit-D Deficiency, Fractures)   Perimenopausal    Discussed irregularity of menstrual cycle and that menopause is after 1 year without menstrual cycle      Leg cramps    Intermittent Check CMP, Magnesium      Relevant Orders   Comprehensive metabolic panel   Magnesium    Other Visit Diagnoses    Encounter for annual physical exam    -  Primary   Relevant Orders   CBC with Differential/Platelet   Comprehensive metabolic panel   Lipid panel   TSH   B12   VITAMIN D 25 Hydroxy (Vit-D Deficiency, Fractures)   Magnesium   Need for influenza vaccination       Relevant Orders   Flu Vaccine QUAD 36+ mos IM (Completed)       Return in about 1 year (around 06/03/2020) for CPE.   The entirety of the information documented in the History of Present Illness, Review of Systems and Physical Exam  were personally obtained by me. Portions of this information were initially documented by [facility], CMA and reviewed by me for thoroughness and accuracy.    Adriana Quinby, Dionne Bucy, MD MPH [facility]

## 2019-06-04 ENCOUNTER — Encounter: Payer: Self-pay | Admitting: Family Medicine

## 2019-06-04 ENCOUNTER — Ambulatory Visit (INDEPENDENT_AMBULATORY_CARE_PROVIDER_SITE_OTHER): Payer: BC Managed Care – PPO | Admitting: Family Medicine

## 2019-06-04 ENCOUNTER — Other Ambulatory Visit: Payer: Self-pay

## 2019-06-04 VITALS — BP 109/74 | HR 78 | Temp 97.5°F | Ht 63.0 in | Wt 132.2 lb

## 2019-06-04 DIAGNOSIS — Z23 Encounter for immunization: Secondary | ICD-10-CM | POA: Diagnosis not present

## 2019-06-04 DIAGNOSIS — R252 Cramp and spasm: Secondary | ICD-10-CM | POA: Diagnosis not present

## 2019-06-04 DIAGNOSIS — Z0000 Encounter for general adult medical examination without abnormal findings: Secondary | ICD-10-CM | POA: Diagnosis not present

## 2019-06-04 DIAGNOSIS — N951 Menopausal and female climacteric states: Secondary | ICD-10-CM | POA: Diagnosis not present

## 2019-06-04 DIAGNOSIS — R5382 Chronic fatigue, unspecified: Secondary | ICD-10-CM | POA: Diagnosis not present

## 2019-06-04 DIAGNOSIS — E559 Vitamin D deficiency, unspecified: Secondary | ICD-10-CM | POA: Diagnosis not present

## 2019-06-04 NOTE — Patient Instructions (Signed)

## 2019-06-04 NOTE — Assessment & Plan Note (Signed)
Discussed irregularity of menstrual cycle and that menopause is after 1 year without menstrual cycle

## 2019-06-04 NOTE — Assessment & Plan Note (Signed)
Recheck Vit D level Not currently on supplement

## 2019-06-04 NOTE — Assessment & Plan Note (Signed)
Likely related to hormonal changes and perimenopause, but will check CBC, CMP, Vit D, Vit B12, TSH

## 2019-06-04 NOTE — Assessment & Plan Note (Signed)
Intermittent Check CMP, Magnesium

## 2019-06-05 LAB — CBC WITH DIFFERENTIAL/PLATELET
Basophils Absolute: 0 10*3/uL (ref 0.0–0.2)
Basos: 1 %
EOS (ABSOLUTE): 0 10*3/uL (ref 0.0–0.4)
Eos: 1 %
Hematocrit: 41.2 % (ref 34.0–46.6)
Hemoglobin: 14.1 g/dL (ref 11.1–15.9)
Immature Grans (Abs): 0 10*3/uL (ref 0.0–0.1)
Immature Granulocytes: 0 %
Lymphocytes Absolute: 1.8 10*3/uL (ref 0.7–3.1)
Lymphs: 34 %
MCH: 31.9 pg (ref 26.6–33.0)
MCHC: 34.2 g/dL (ref 31.5–35.7)
MCV: 93 fL (ref 79–97)
Monocytes Absolute: 0.3 10*3/uL (ref 0.1–0.9)
Monocytes: 6 %
Neutrophils Absolute: 3 10*3/uL (ref 1.4–7.0)
Neutrophils: 58 %
Platelets: 257 10*3/uL (ref 150–450)
RBC: 4.42 x10E6/uL (ref 3.77–5.28)
RDW: 12.7 % (ref 11.7–15.4)
WBC: 5.2 10*3/uL (ref 3.4–10.8)

## 2019-06-05 LAB — COMPREHENSIVE METABOLIC PANEL
ALT: 12 IU/L (ref 0–32)
AST: 17 IU/L (ref 0–40)
Albumin/Globulin Ratio: 1.7 (ref 1.2–2.2)
Albumin: 4 g/dL (ref 3.8–4.8)
Alkaline Phosphatase: 51 IU/L (ref 39–117)
BUN/Creatinine Ratio: 12 (ref 9–23)
BUN: 10 mg/dL (ref 6–24)
Bilirubin Total: 0.4 mg/dL (ref 0.0–1.2)
CO2: 24 mmol/L (ref 20–29)
Calcium: 9.2 mg/dL (ref 8.7–10.2)
Chloride: 104 mmol/L (ref 96–106)
Creatinine, Ser: 0.82 mg/dL (ref 0.57–1.00)
GFR calc Af Amer: 98 mL/min/{1.73_m2} (ref >59)
GFR calc non Af Amer: 85 mL/min/{1.73_m2} (ref >59)
Globulin, Total: 2.4 g/dL (ref 1.5–4.5)
Glucose: 93 mg/dL (ref 65–99)
Potassium: 4.1 mmol/L (ref 3.5–5.2)
Sodium: 141 mmol/L (ref 134–144)
Total Protein: 6.4 g/dL (ref 6.0–8.5)

## 2019-06-05 LAB — MAGNESIUM: Magnesium: 1.8 mg/dL (ref 1.6–2.3)

## 2019-06-05 LAB — LIPID PANEL
Chol/HDL Ratio: 3.6 ratio (ref 0.0–4.4)
Cholesterol, Total: 183 mg/dL (ref 100–199)
HDL: 51 mg/dL (ref >39)
LDL Chol Calc (NIH): 112 mg/dL — ABNORMAL HIGH (ref 0–99)
Triglycerides: 113 mg/dL (ref 0–149)
VLDL Cholesterol Cal: 20 mg/dL (ref 5–40)

## 2019-06-05 LAB — TSH: TSH: 1.25 u[IU]/mL (ref 0.450–4.500)

## 2019-06-05 LAB — VITAMIN B12: Vitamin B-12: 516 pg/mL (ref 232–1245)

## 2019-06-05 LAB — VITAMIN D 25 HYDROXY (VIT D DEFICIENCY, FRACTURES): Vit D, 25-Hydroxy: 23 ng/mL — ABNORMAL LOW (ref 30.0–100.0)

## 2019-06-06 ENCOUNTER — Telehealth: Payer: Self-pay

## 2019-06-06 ENCOUNTER — Ambulatory Visit: Payer: BLUE CROSS/BLUE SHIELD | Admitting: Obstetrics and Gynecology

## 2019-06-06 NOTE — Telephone Encounter (Signed)
-----   Message from Virginia Crews, MD sent at 06/06/2019  1:45 PM EDT ----- Normal labs, except low Vitamin D level.  Recommend supplementation with Vit D3 1000-2000 units daily.

## 2019-06-06 NOTE — Telephone Encounter (Signed)
Patient advised.

## 2019-06-06 NOTE — Telephone Encounter (Signed)
LMTCB

## 2019-06-20 ENCOUNTER — Encounter: Payer: Self-pay | Admitting: Obstetrics and Gynecology

## 2019-06-20 ENCOUNTER — Ambulatory Visit (INDEPENDENT_AMBULATORY_CARE_PROVIDER_SITE_OTHER): Payer: BC Managed Care – PPO | Admitting: Obstetrics and Gynecology

## 2019-06-20 ENCOUNTER — Other Ambulatory Visit: Payer: Self-pay

## 2019-06-20 ENCOUNTER — Other Ambulatory Visit (HOSPITAL_COMMUNITY)
Admission: RE | Admit: 2019-06-20 | Discharge: 2019-06-20 | Disposition: A | Discharge status: Home / Self Care | Payer: BC Managed Care – PPO | Source: Ambulatory Visit | Attending: Obstetrics and Gynecology | Admitting: Obstetrics and Gynecology

## 2019-06-20 VITALS — BP 118/74 | Ht 63.0 in | Wt 130.0 lb

## 2019-06-20 DIAGNOSIS — Z8619 Personal history of other infectious and parasitic diseases: Secondary | ICD-10-CM

## 2019-06-20 DIAGNOSIS — Z1331 Encounter for screening for depression: Secondary | ICD-10-CM

## 2019-06-20 DIAGNOSIS — Z01419 Encounter for gynecological examination (general) (routine) without abnormal findings: Secondary | ICD-10-CM | POA: Insufficient documentation

## 2019-06-20 DIAGNOSIS — Z1339 Encounter for screening examination for other mental health and behavioral disorders: Secondary | ICD-10-CM

## 2019-06-20 DIAGNOSIS — Z124 Encounter for screening for malignant neoplasm of cervix: Secondary | ICD-10-CM | POA: Diagnosis not present

## 2019-06-20 DIAGNOSIS — N921 Excessive and frequent menstruation with irregular cycle: Secondary | ICD-10-CM | POA: Diagnosis not present

## 2019-06-20 NOTE — Progress Notes (Signed)
Gynecology Annual Exam   PCP: Erasmo DownerBacigalupo, Angela M, MD  Chief Complaint  Patient presents with  . Annual Exam   History of Present Illness:  [name] is a 48 y.o. [id number] who LMP was Patient's last menstrual period was 06/12/2019., presents today for her annual examination.  Her menses are coming every 2 weeks, lasting 7 day(s).  Usually just before she has a period she has pain on both sides, shooting down her legs.  Sometimes there is more time between periods.  She also passes clots.  Reviewing her ultrasound from last year it appears there was a cystic structure within the uterus that might represent a polyp.    She does not have vasomotor sx.   She is sexually active.  Her boyfriend has had a vasectomy. No pain with intercourse.   Last Pap: 07/21/2017  Results were: no abnormalities, HPV DNA negative (positive in 2017) Hx of STDs: HPV  Last mammogram: 2 months ago  Results were: normal--routine follow-up in 12 months There is no FH of breast cancer. There is no FH of ovarian cancer. The patient does not do self-breast exams.   Tobacco use: The patient denies current or previous tobacco use. Alcohol use: social drinker Exercise: not active  The patient wears seatbelts: yes.     Past Medical History:  Diagnosis Date  . Bipolar disorder (HCC)   . Fibromyalgia   . Irritable bowel syndrome (IBS)    Past Surgical History:  Procedure Laterality Date  . AUGMENTATION MAMMAPLASTY  09/04/2008  . BREAST ENHANCEMENT SURGERY  2010  . laproscropic ovarion cyst  1999  . WISDOM TOOTH EXTRACTION  1995    Prior to Admission medications   Medication Sig Start Date End Date Taking? Authorizing Provider  lamoTRIgine (LAMICTAL) 200 MG tablet Take 200 mg by mouth at bedtime. 06/06/15  Yes [provider]  QUEtiapine Fumarate (SEROQUEL XR) 150 MG 24 hr tablet  04/15/18  Yes [provider]   Allergies: No Known Allergies  Obstetric History: [id number], s/p SVD x  2, history of molar pregnancy  Family History  Problem Relation Age of Onset  . Diabetes Mother        non-insulin dependent  . ALS Maternal Grandmother   . Cancer Maternal Grandfather        prostate  . Aortic aneurysm Maternal Grandfather   . Stroke Paternal Grandmother   . Aortic aneurysm Paternal Grandfather     Social History   Socioeconomic History  . Marital status: Divorced    Spouse name: Not on file  . Number of children: Not on file  . Years of education: Not on file  . Highest education level: Not on file  Occupational History  . Not on file  Social Needs  . Financial resource strain: Not on file  . Food insecurity    Worry: Not on file    Inability: Not on file  . Transportation needs    Medical: Not on file    Non-medical: Not on file  Tobacco Use  . Smoking status: Never Smoker  . Smokeless tobacco: Never Used  Substance and Sexual Activity  . Alcohol use: Yes    Alcohol/week: 0.0 standard drinks    Comment: occasional  . Drug use: No  . Sexual activity: Yes    Birth control/protection: Surgical    Comment: thoughts of vasectomy  Lifestyle  . Physical activity    Days per week: Not on file  Minutes per session: Not on file  . Stress: Not on file  Relationships  . Social Musician on phone: Not on file    Gets together: Not on file    Attends religious service: Not on file    Active member of club or organization: Not on file    Attends meetings of clubs or organizations: Not on file    Relationship status: Not on file  . Intimate partner violence    Fear of current or ex partner: Not on file    Emotionally abused: Not on file    Physically abused: Not on file    Forced sexual activity: Not on file  Other Topics Concern  . Not on file  Social History Narrative  . Not on file    Review of Systems  Constitutional: Negative.   HENT: Negative.   Eyes: Negative.   Respiratory: Negative.   Cardiovascular: Negative.    Gastrointestinal: Negative.   Genitourinary: Negative.   Musculoskeletal: Negative.   Skin: Negative.   Neurological: Negative.   Psychiatric/Behavioral: Negative.      Physical Exam BP 118/74   Ht 5\' 3"  (1.6 m)   Wt 130 lb (59 kg)   LMP 06/12/2019   BMI 23.03 kg/m   Physical Exam Constitutional:      General: She is not in acute distress.    Appearance: Normal appearance. She is well-developed.  Genitourinary:     Pelvic exam was performed with patient in the lithotomy position.     Vulva, urethra, bladder and uterus normal.     No inguinal adenopathy present in the right or left side.    No signs of injury in the vagina.     No vaginal discharge, erythema, tenderness or bleeding.     No cervical motion tenderness, discharge, lesion or polyp.     Uterus is mobile.     Uterus is not enlarged or tender.     No uterine mass detected.    Uterus is anteverted and regular.     No right or left adnexal mass present.     Right adnexa not tender or full.     Left adnexa not tender or full.     Genitourinary Comments: Cervix is a bit stenotic. Not easy to pass the cytobrush for cytologic collection of Pap smear  HENT:     Head: Normocephalic and atraumatic.  Eyes:     General: No scleral icterus.    Conjunctiva/sclera: Conjunctivae normal.  Neck:     Musculoskeletal: Normal range of motion and neck supple.     Thyroid: No thyromegaly.  Cardiovascular:     Rate and Rhythm: Normal rate and regular rhythm.     Heart sounds: No murmur. No friction rub. No gallop.   Pulmonary:     Effort: Pulmonary effort is normal. No respiratory distress.     Breath sounds: Normal breath sounds. No wheezing or rales.  Chest:     Breasts:        Right: No inverted nipple, mass, nipple discharge, skin change or tenderness.        Left: No inverted nipple, mass, nipple discharge, skin change or tenderness.  Abdominal:     General: Bowel sounds are normal. There is no distension.      Palpations: Abdomen is soft. There is no mass.     Tenderness: There is no abdominal tenderness. There is no guarding or rebound.  Musculoskeletal: Normal range of motion.  General: No swelling or tenderness.  Lymphadenopathy:     Cervical: No cervical adenopathy.     Lower Body: No right inguinal adenopathy. No left inguinal adenopathy.  Neurological:     General: No focal deficit present.     Mental Status: She is alert and oriented to person, place, and time.     Cranial Nerves: No cranial nerve deficit.  Skin:    General: Skin is warm and dry.     Findings: No erythema or rash.  Psychiatric:        Mood and Affect: Mood normal.        Behavior: Behavior normal.        Judgment: Judgment normal.     Female chaperone present for pelvic and breast  portions of the physical exam  Results: AUDIT Questionnaire (screen for alcoholism): 2 PHQ-9: 1  Assessment: 48 y.o. [id number] female here for routine gynecologic examination.  Plan: Problem List Items Addressed This Visit    None    Visit Diagnoses    Women's annual routine gynecological examination    -  Primary   Relevant Orders   Cytology - PAP   Screening for depression       Screening for alcoholism       Pap smear for cervical cancer screening       Relevant Orders   Cytology - PAP   History of infection due to human papilloma virus (HPV)       Menorrhagia with irregular cycle       Relevant Orders   US PELVIS TRANSVAGINAL NON-OB (TV ONLY)   Cytology - PAP      Screening: -- Blood pressure screen normal -- Colonoscopy - not due -- Mammogram - not due -- Weight screening: normal -- Depression screening negative (PHQ-9) -- Nutrition: normal -- cholesterol screening: per PCP -- osteoporosis screening: not due -- tobacco screening: not using -- alcohol screening: AUDIT questionnaire indicates low-risk usage. -- family history of breast cancer screening: done. not at high risk. -- no evidence of domestic  violence or intimate partner violence. -- STD screening: gonorrhea/chlamydia NAAT not collected per patient request. -- pap smear collected per ASCCP guidelines -- flu vaccine received already this year -- HPV vaccination series: not eligilbe   Menorrhagia with irregular cycle: will repeat ultrasound. Reviewed imaging from last year.  Possible polyp. Certainly thickened somewhat with cystic area.  Discussed further investigation with [facility], endometrial biopsy to further investigate. Will start with ultrasound.   Prentice Docker, MD 06/20/2019 9:40 AM

## 2019-06-26 LAB — CYTOLOGY - PAP
Comment: NEGATIVE
Diagnosis: NEGATIVE
High risk HPV: NEGATIVE

## 2019-07-01 ENCOUNTER — Other Ambulatory Visit: Payer: BC Managed Care – PPO

## 2019-07-01 ENCOUNTER — Ambulatory Visit: Payer: BC Managed Care – PPO | Admitting: Obstetrics and Gynecology

## 2019-07-11 ENCOUNTER — Other Ambulatory Visit: Payer: Self-pay | Admitting: *Deleted

## 2019-07-11 DIAGNOSIS — Z20822 Contact with and (suspected) exposure to covid-19: Secondary | ICD-10-CM

## 2019-07-12 LAB — NOVEL CORONAVIRUS, NAA: SARS-CoV-2, NAA: NOT DETECTED

## 2019-07-16 DIAGNOSIS — K130 Diseases of lips: Secondary | ICD-10-CM | POA: Diagnosis not present

## 2019-07-16 DIAGNOSIS — B370 Candidal stomatitis: Secondary | ICD-10-CM | POA: Diagnosis not present

## 2019-07-18 ENCOUNTER — Ambulatory Visit: Payer: BC Managed Care – PPO | Admitting: Obstetrics and Gynecology

## 2019-07-18 ENCOUNTER — Ambulatory Visit: Payer: BC Managed Care – PPO

## 2019-11-03 DIAGNOSIS — H5213 Myopia, bilateral: Secondary | ICD-10-CM | POA: Diagnosis not present

## 2019-12-23 NOTE — Progress Notes (Signed)
Established patient visit  I,Bretlyn Ward,acting as a scribe for Trinna Post, PA-C.,have documented all relevant documentation on the behalf of Trinna Post, PA-C,as directed by  Trinna Post, PA-C while in the presence of Trinna Post, PA-C.   Patient: Tanya Cochran   DOB: Oct 28, 1970   49 y.o. Female  MRN: [id number] Visit Date: 12/24/2019  Today's healthcare provider: Trinna Post, PA-C   Chief Complaint  Patient presents with  . Vaginal Itching   Subjective    Vaginal Itching The patient's primary symptoms include genital itching. The patient's pertinent negatives include no genital rash or vaginal discharge. This is a new problem. The current episode started in the past 7 days. The problem occurs constantly. The problem has been gradually improving. The patient is experiencing no pain. The problem affects both sides. She is not pregnant. Nothing aggravates the symptoms. She has tried nothing for the symptoms. The treatment provided no relief. She is sexually active. No, her partner does not have an STD. Her past medical history is significant for vaginosis.   She reports that the itching started on Saturday. She denies discharge. She has been using coconut oil on the outer part of the area which she believes it has been helping. She is sexually active with one partner with unprotected sex. She is going to [place] next week. She denies any vaginal discharge or any redness in the area. She mentioned that it does it all the back to her but at times. Denies fever.     Medications: Outpatient Medications Prior to Visit  Medication Sig  . nystatin (MYCOSTATIN) 100000 UNIT/ML suspension Take by mouth.  . nystatin ointment (MYCOSTATIN) Apply topically.  . triamcinolone ointment (KENALOG) 0.1 % Apply topically.  . lamoTRIgine (LAMICTAL) 100 MG tablet Take by mouth.  . lamoTRIgine (LAMICTAL) 200 MG tablet Take 200 mg by mouth at bedtime.  Marland Kitchen QUEtiapine (SEROQUEL) 100 MG  tablet Take by mouth.  . QUEtiapine Fumarate (SEROQUEL XR) 150 MG 24 hr tablet    No facility-administered medications prior to visit.    Review of Systems  Constitutional: Negative.   HENT: Negative.   Eyes: Negative.   Respiratory: Negative.   Cardiovascular: Negative.   Gastrointestinal: Negative.   Endocrine: Negative.   Genitourinary: Negative.  Negative for vaginal discharge and vaginal pain.  Musculoskeletal: Negative.   Skin: Negative.   Allergic/Immunologic: Negative.   Neurological: Negative.   Hematological: Negative.   Psychiatric/Behavioral: Negative.        Objective    BP 106/73   Pulse 81   Temp (!) 97.1 F (36.2 C) (Temporal)   Resp 16   Ht 5\' 3"  (1.6 m)   Wt 131 lb (59.4 kg)   BMI 23.21 kg/m    Physical Exam Exam conducted with a chaperone present.  Constitutional:      Appearance: Normal appearance.  Cardiovascular:     Rate and Rhythm: Normal rate.  Pulmonary:     Effort: Pulmonary effort is normal.  Genitourinary:    Exam position: Supine.     Pubic Area: Rash present.     Vagina: No vaginal discharge.       Comments: Labia majora bilaterally erythematous.  Skin:    General: Skin is warm and dry.  Neurological:     Mental Status: She is alert and oriented to person, place, and time.  Psychiatric:        Mood and Affect: Mood normal.  Behavior: Behavior normal.       Results for orders placed or performed in visit on 12/24/19  POCT urinalysis dipstick  Result Value Ref Range   Color, UA Yellow    Clarity, UA Clear    Glucose, UA Negative Negative   Bilirubin, UA Negative    Ketones, UA Negative    Spec Grav, UA 1.010 1.010 - 1.025   Blood, UA Negative    pH, UA 6.0 5.0 - 8.0   Protein, UA Negative Negative   Urobilinogen, UA 0.2 0.2 or 1.0 E.U./dL   Nitrite, UA Negative    Leukocytes, UA Negative Negative   Appearance     Odor       Assessment & Plan    1. Vaginal irritation Vaginal itching cream has been  prescribed. A vaginal swab to check for STI's and vaginitis has been sent to the lab and we will call her with results. She will F/U as needed. Her UA was normal.   - terconazole (TERAZOL 7) 0.4 % vaginal cream; Place 1 applicator vaginally at bedtime.  Dispense: 45 g; Refill: 0 - Cervicovaginal ancillary only - POCT urinalysis dipstick   Return if symptoms worsen or fail to improve.      ITrey Sailors, PA-C, have reviewed all documentation for this visit. The documentation on 12/24/19 for the exam, diagnosis, procedures, and orders are all accurate and complete.    Maryella Shivers  [facility] [phone] (phone) [phone] (fax)  [facility]

## 2019-12-24 ENCOUNTER — Ambulatory Visit: Payer: BC Managed Care – PPO | Admitting: Physician Assistant

## 2019-12-24 ENCOUNTER — Telehealth: Payer: Self-pay

## 2019-12-24 ENCOUNTER — Other Ambulatory Visit: Payer: Self-pay

## 2019-12-24 ENCOUNTER — Encounter: Payer: Self-pay | Admitting: Physician Assistant

## 2019-12-24 ENCOUNTER — Other Ambulatory Visit (HOSPITAL_COMMUNITY)
Admission: RE | Admit: 2019-12-24 | Discharge: 2019-12-24 | Disposition: A | Discharge status: Home / Self Care | Payer: BC Managed Care – PPO | Source: Ambulatory Visit | Attending: Physician Assistant | Admitting: Physician Assistant

## 2019-12-24 VITALS — BP 106/73 | HR 81 | Temp 97.1°F | Resp 16 | Ht 63.0 in | Wt 131.0 lb

## 2019-12-24 DIAGNOSIS — N898 Other specified noninflammatory disorders of vagina: Secondary | ICD-10-CM | POA: Insufficient documentation

## 2019-12-24 DIAGNOSIS — N771 Vaginitis, vulvitis and vulvovaginitis in diseases classified elsewhere: Secondary | ICD-10-CM | POA: Insufficient documentation

## 2019-12-24 LAB — POCT URINALYSIS DIPSTICK
Bilirubin, UA: NEGATIVE
Blood, UA: NEGATIVE
Glucose, UA: NEGATIVE
Ketones, UA: NEGATIVE
Leukocytes, UA: NEGATIVE
Nitrite, UA: NEGATIVE
Protein, UA: NEGATIVE
Spec Grav, UA: 1.01 (ref 1.010–1.025)
Urobilinogen, UA: 0.2 E.U./dL
pH, UA: 6 (ref 5.0–8.0)

## 2019-12-24 MED ORDER — TERCONAZOLE 0.4 % VA CREA: 1.0000 | TOPICAL_CREAM | Freq: Every day | VAGINAL | 0 refills | Status: DC

## 2019-12-24 NOTE — Addendum Note (Signed)
Addended by: Trey Sailors on: 12/24/2019 03:12 PM   Modules accepted: Orders

## 2019-12-24 NOTE — Patient Instructions (Signed)

## 2019-12-26 LAB — CERVICOVAGINAL ANCILLARY ONLY
Bacterial Vaginitis (gardnerella): NEGATIVE
Candida Glabrata: NEGATIVE
Candida Vaginitis: NEGATIVE
Chlamydia: NEGATIVE
Comment: NEGATIVE
Comment: NEGATIVE
Comment: NEGATIVE
Comment: NEGATIVE
Comment: NEGATIVE
Comment: NORMAL
Neisseria Gonorrhea: NEGATIVE
Trichomonas: NEGATIVE

## 2020-01-12 DIAGNOSIS — D229 Melanocytic nevi, unspecified: Secondary | ICD-10-CM | POA: Diagnosis not present

## 2020-01-12 DIAGNOSIS — B370 Candidal stomatitis: Secondary | ICD-10-CM | POA: Diagnosis not present

## 2020-01-12 DIAGNOSIS — L308 Other specified dermatitis: Secondary | ICD-10-CM | POA: Diagnosis not present

## 2020-01-12 DIAGNOSIS — L814 Other melanin hyperpigmentation: Secondary | ICD-10-CM | POA: Diagnosis not present

## 2020-03-16 ENCOUNTER — Other Ambulatory Visit: Payer: Self-pay | Admitting: Family Medicine

## 2020-03-16 DIAGNOSIS — Z1231 Encounter for screening mammogram for malignant neoplasm of breast: Secondary | ICD-10-CM

## 2020-04-14 ENCOUNTER — Other Ambulatory Visit: Payer: Self-pay | Admitting: Obstetrics and Gynecology

## 2020-04-14 ENCOUNTER — Other Ambulatory Visit: Payer: Self-pay

## 2020-04-14 ENCOUNTER — Ambulatory Visit
Admission: RE | Admit: 2020-04-14 | Discharge: 2020-04-14 | Disposition: A | Discharge status: Home / Self Care | Payer: BC Managed Care – PPO | Source: Ambulatory Visit | Attending: Family Medicine | Admitting: Family Medicine

## 2020-04-14 DIAGNOSIS — Z1231 Encounter for screening mammogram for malignant neoplasm of breast: Secondary | ICD-10-CM

## 2020-05-18 DIAGNOSIS — Z03818 Encounter for observation for suspected exposure to other biological agents ruled out: Secondary | ICD-10-CM | POA: Diagnosis not present

## 2020-05-18 DIAGNOSIS — Z20822 Contact with and (suspected) exposure to covid-19: Secondary | ICD-10-CM | POA: Diagnosis not present

## 2020-06-09 ENCOUNTER — Encounter: Payer: Self-pay | Admitting: Family Medicine

## 2020-06-15 ENCOUNTER — Other Ambulatory Visit: Payer: Self-pay

## 2020-06-15 ENCOUNTER — Encounter: Payer: Self-pay | Admitting: Family Medicine

## 2020-06-15 ENCOUNTER — Ambulatory Visit (INDEPENDENT_AMBULATORY_CARE_PROVIDER_SITE_OTHER): Payer: BC Managed Care – PPO | Admitting: Family Medicine

## 2020-06-15 VITALS — BP 101/67 | HR 94 | Temp 99.0°F | Ht 63.0 in | Wt 132.0 lb

## 2020-06-15 DIAGNOSIS — Z0000 Encounter for general adult medical examination without abnormal findings: Secondary | ICD-10-CM | POA: Diagnosis not present

## 2020-06-15 DIAGNOSIS — E559 Vitamin D deficiency, unspecified: Secondary | ICD-10-CM

## 2020-06-15 DIAGNOSIS — Z7185 Encounter for immunization safety counseling: Secondary | ICD-10-CM

## 2020-06-15 DIAGNOSIS — Z1159 Encounter for screening for other viral diseases: Secondary | ICD-10-CM

## 2020-06-15 DIAGNOSIS — R5382 Chronic fatigue, unspecified: Secondary | ICD-10-CM | POA: Diagnosis not present

## 2020-06-15 NOTE — Patient Instructions (Signed)
The CDC recommends two doses of Shingrix (the shingles vaccine) separated by 2 to 6 months for adults age 50 years and older. I recommend checking with your insurance plan regarding coverage for this vaccine.      Preventive Care 35-87 Years Old, Female Preventive care refers to visits with your health care provider and lifestyle choices that can promote health and wellness. This includes:  A yearly physical exam. This may also be called an annual well check.  Regular dental visits and eye exams.  Immunizations.  Screening for certain conditions.  Healthy lifestyle choices, such as eating a healthy diet, getting regular exercise, not using drugs or products that contain nicotine and tobacco, and limiting alcohol use. What can I expect for my preventive care visit? Physical exam Your health care provider will check your:  Height and weight. This may be used to calculate body mass index (BMI), which tells if you are at a healthy weight.  Heart rate and blood pressure.  Skin for abnormal spots. Counseling Your health care provider may ask you questions about your:  Alcohol, tobacco, and drug use.  Emotional well-being.  Home and relationship well-being.  Sexual activity.  Eating habits.  Work and work Statistician.  Method of birth control.  Menstrual cycle.  Pregnancy history. What immunizations do I need?  Influenza (flu) vaccine  This is recommended every year. Tetanus, diphtheria, and pertussis (Tdap) vaccine  You may need a Td booster every 10 years. Varicella (chickenpox) vaccine  You may need this if you have not been vaccinated. Zoster (shingles) vaccine  You may need this after age 72. Measles, mumps, and rubella (MMR) vaccine  You may need at least one dose of MMR if you were born in 1957 or later. You may also need a second dose. Pneumococcal conjugate (PCV13) vaccine  You may need this if you have certain conditions and were not previously  vaccinated. Pneumococcal polysaccharide (PPSV23) vaccine  You may need one or two doses if you smoke cigarettes or if you have certain conditions. Meningococcal conjugate (MenACWY) vaccine  You may need this if you have certain conditions. Hepatitis A vaccine  You may need this if you have certain conditions or if you travel or work in places where you may be exposed to hepatitis A. Hepatitis B vaccine  You may need this if you have certain conditions or if you travel or work in places where you may be exposed to hepatitis B. Haemophilus influenzae type b (Hib) vaccine  You may need this if you have certain conditions. Human papillomavirus (HPV) vaccine  If recommended by your health care provider, you may need three doses over 6 months. You may receive vaccines as individual doses or as more than one vaccine together in one shot (combination vaccines). Talk with your health care provider about the risks and benefits of combination vaccines. What tests do I need? Blood tests  Lipid and cholesterol levels. These may be checked every 5 years, or more frequently if you are over 48 years old.  Hepatitis C test.  Hepatitis B test. Screening  Lung cancer screening. You may have this screening every year starting at age 29 if you have a 30-pack-year history of smoking and currently smoke or have quit within the past 15 years.  Colorectal cancer screening. All adults should have this screening starting at age 71 and continuing until age 51. Your health care provider may recommend screening at age 51 if you are at increased risk. You  will have tests every 1-10 years, depending on your results and the type of screening test.  Diabetes screening. This is done by checking your blood sugar (glucose) after you have not eaten for a while (fasting). You may have this done every 1-3 years.  Mammogram. This may be done every 1-2 years. Talk with your health care provider about when you should start  having regular mammograms. This may depend on whether you have a family history of breast cancer.  BRCA-related cancer screening. This may be done if you have a family history of breast, ovarian, tubal, or peritoneal cancers.  Pelvic exam and Pap test. This may be done every 3 years starting at age 21. Starting at age 30, this may be done every 5 years if you have a Pap test in combination with an HPV test. Other tests  Sexually transmitted disease (STD) testing.  Bone density scan. This is done to screen for osteoporosis. You may have this scan if you are at high risk for osteoporosis. Follow these instructions at home: Eating and drinking  Eat a diet that includes fresh fruits and vegetables, whole grains, lean protein, and low-fat dairy.  Take vitamin and mineral supplements as recommended by your health care provider.  Do not drink alcohol if: ? Your health care provider tells you not to drink. ? You are pregnant, may be pregnant, or are planning to become pregnant.  If you drink alcohol: ? Limit how much you have to 0-1 drink a day. ? Be aware of how much alcohol is in your drink. In the [place] one drink equals one 12 oz bottle of beer (355 mL), one 5 oz glass of wine (148 mL), or one 1 oz glass of hard liquor (44 mL). Lifestyle  Take daily care of your teeth and gums.  Stay active. Exercise for at least 30 minutes on 5 or more days each week.  Do not use any products that contain nicotine or tobacco, such as cigarettes, e-cigarettes, and chewing tobacco. If you need help quitting, ask your health care provider.  If you are sexually active, practice safe sex. Use a condom or other form of birth control (contraception) in order to prevent pregnancy and STIs (sexually transmitted infections).  If told by your health care provider, take low-dose aspirin daily starting at age 50. What's next?  Visit your health care provider once a year for a well check visit.  Ask your health  care provider how often you should have your eyes and teeth checked.  Stay up to date on all vaccines. This information is not intended to replace advice given to you by your health care provider. Make sure you discuss any questions you have with your health care provider. Document Revised: 05/02/2018 Document Reviewed: 05/02/2018 Elsevier Patient Education  2020 [employer]  

## 2020-06-15 NOTE — Progress Notes (Signed)
Complete physical exam   Patient: Tanya Cochran   DOB: July 10, 1971   49 y.o. Female  MRN: [id number] Visit Date: 06/15/2020  Today's healthcare provider: Shirlee Latch, MD   Chief Complaint  Patient presents with  . Annual Exam   Subjective    Tanya Cochran is a 49 y.o. female who presents today for a complete physical exam.  She reports consuming a general diet. The patient does not participate in regular exercise at present. She generally feels well. She reports sleeping well. She does not have additional problems to discuss today.  HPI    Past Medical History:  Diagnosis Date  . Bipolar disorder (HCC)   . Fibromyalgia   . Irritable bowel syndrome (IBS)    Past Surgical History:  Procedure Laterality Date  . AUGMENTATION MAMMAPLASTY  09/04/2008  . BREAST ENHANCEMENT SURGERY  2010  . laproscropic ovarion cyst  1999  . WISDOM TOOTH EXTRACTION  1995   Social History   Socioeconomic History  . Marital status: Divorced    Spouse name: Not on file  . Number of children: Not on file  . Years of education: Not on file  . Highest education level: Not on file  Occupational History  . Not on file  Tobacco Use  . Smoking status: Never Smoker  . Smokeless tobacco: Never Used  Vaping Use  . Vaping Use: Never used  Substance and Sexual Activity  . Alcohol use: Yes    Alcohol/week: 0.0 standard drinks    Comment: occasional  . Drug use: No  . Sexual activity: Yes    Birth control/protection: Surgical    Comment: thoughts of vasectomy  Other Topics Concern  . Not on file  Social History Narrative  . Not on file   Social Determinants of Health   Financial Resource Strain:   . Difficulty of Paying Living Expenses: Not on file  Food Insecurity:   . Worried About Programme researcher, broadcasting/film/video in the Last Year: Not on file  . Ran Out of Food in the Last Year: Not on file  Transportation Needs:   . Lack of Transportation (Medical): Not on file  . Lack of Transportation  (Non-Medical): Not on file  Physical Activity:   . Days of Exercise per Week: Not on file  . Minutes of Exercise per Session: Not on file  Stress:   . Feeling of Stress : Not on file  Social Connections:   . Frequency of Communication with Friends and Family: Not on file  . Frequency of Social Gatherings with Friends and Family: Not on file  . Attends Religious Services: Not on file  . Active Member of Clubs or Organizations: Not on file  . Attends Banker Meetings: Not on file  . Marital Status: Not on file  Intimate Partner Violence:   . Fear of Current or Ex-Partner: Not on file  . Emotionally Abused: Not on file  . Physically Abused: Not on file  . Sexually Abused: Not on file   Family Status  Relation Name Status  . Mother  Alive  . Father  Alive  . Brother  Deceased  . MGM  Deceased  . MGF  Deceased  . PGM  Deceased  . PGF  Deceased  . Neg Hx  (Not Specified)   Family History  Problem Relation Age of Onset  . Diabetes Mother        non-insulin dependent  . Aortic aneurysm Mother   .  Heart disease Father   . Atrial fibrillation Father   . ALS Maternal Grandmother   . Aortic aneurysm Maternal Grandfather   . Prostate cancer Maternal Grandfather        prostate  . Stroke Paternal Grandmother   . Aortic aneurysm Paternal Grandfather   . Colon cancer Neg Hx   . Breast cancer Neg Hx   . Ovarian cancer Neg Hx    No Known Allergies  Patient Care Team: Erasmo Downer, MD as PCP - General (Family Medicine)   Medications: Outpatient Medications Prior to Visit  Medication Sig  . lamoTRIgine (LAMICTAL) 100 MG tablet Take by mouth.  . lamoTRIgine (LAMICTAL) 200 MG tablet Take 200 mg by mouth at bedtime.  Marland Kitchen QUEtiapine (SEROQUEL) 100 MG tablet Take by mouth.  . QUEtiapine Fumarate (SEROQUEL XR) 150 MG 24 hr tablet   . nystatin (MYCOSTATIN) 100000 UNIT/ML suspension Take by mouth.  . nystatin ointment (MYCOSTATIN) Apply topically.  Marland Kitchen terconazole  (TERAZOL 7) 0.4 % vaginal cream Place 1 applicator vaginally at bedtime.  . triamcinolone ointment (KENALOG) 0.1 % Apply topically.   No facility-administered medications prior to visit.    Review of Systems  Constitutional: Negative.   HENT: Negative.   Eyes: Negative.   Respiratory: Negative.   Cardiovascular: Negative.   Gastrointestinal: Negative.   Endocrine: Negative.   Genitourinary: Negative.   Musculoskeletal: Negative.   Skin: Negative.   Allergic/Immunologic: Negative.   Neurological: Negative.   Hematological: Negative.   Psychiatric/Behavioral: Negative.       Objective    BP 101/67 (BP Location: Right Arm, Patient Position: Sitting, Cuff Size: Large)   Pulse 94   Temp 99 F (37.2 C) (Oral)   Ht 5\' 3"  (1.6 m)   Wt 132 lb (59.9 kg)   BMI 23.38 kg/m    Physical Exam Vitals reviewed.  Constitutional:      General: She is not in acute distress.    Appearance: Normal appearance. She is well-developed. She is not diaphoretic.  HENT:     Head: Normocephalic and atraumatic.     Right Ear: Tympanic membrane, ear canal and external ear normal.     Left Ear: Tympanic membrane, ear canal and external ear normal.  Eyes:     General: No scleral icterus.    Conjunctiva/sclera: Conjunctivae normal.     Pupils: Pupils are equal, round, and reactive to light.  Neck:     Thyroid: No thyromegaly.  Cardiovascular:     Rate and Rhythm: Normal rate and regular rhythm.     Pulses: Normal pulses.     Heart sounds: Normal heart sounds. No murmur heard.   Pulmonary:     Effort: Pulmonary effort is normal. No respiratory distress.     Breath sounds: Normal breath sounds. No wheezing or rales.  Abdominal:     General: There is no distension.     Palpations: Abdomen is soft.     Tenderness: There is no abdominal tenderness.  Musculoskeletal:        General: No deformity.     Cervical back: Neck supple.     Right lower leg: No edema.     Left lower leg: No edema.    Lymphadenopathy:     Cervical: No cervical adenopathy.  Skin:    General: Skin is warm and dry.     Findings: No rash.  Neurological:     Mental Status: She is alert and oriented to person, place, and time. Mental status is  at baseline.     Gait: Gait normal.  Psychiatric:        Mood and Affect: Mood normal.        Behavior: Behavior normal.        Thought Content: Thought content normal.       Last depression screening scores PHQ 2/9 Scores 06/15/2020 06/04/2019 05/30/2018  PHQ - 2 Score 0 1 1  PHQ- 9 Score 0 2 4   Last fall risk screening Fall Risk  06/15/2020  Falls in the past year? 0  Number falls in past yr: 0  Injury with Fall? 0  Risk for fall due to : No Fall Risks  Follow up Falls evaluation completed   Last Audit-C alcohol use screening Alcohol Use Disorder Test (AUDIT) 06/15/2020  1. How often do you have a drink containing alcohol? 1  2. How many drinks containing alcohol do you have on a typical day when you are drinking? 1  3. How often do you have six or more drinks on one occasion? 0  AUDIT-C Score 2  Alcohol Brief Interventions/Follow-up AUDIT Score <7 follow-up not indicated   A score of 3 or more in women, and 4 or more in men indicates increased risk for alcohol abuse, EXCEPT if all of the points are from question 1   No results found for any visits on 06/15/20.  Assessment & Plan    Routine Health Maintenance and Physical Exam  Exercise Activities and Dietary recommendations Goals   None     Immunization History  Administered Date(s) Administered  . Influenza,inj,Quad PF,6+ Mos 05/30/2018, 06/04/2019  . Tdap 05/30/2018    Health Maintenance  Topic Date Due  . Hepatitis C Screening  Never done  . COVID-19 Vaccine (1) Never done  . INFLUENZA VACCINE  12/02/2020 (Originally 04/04/2020)  . PAP SMEAR-Modifier  06/19/2022  . TETANUS/TDAP  05/30/2028  . HIV Screening  Completed    Discussed health benefits of physical activity, and  encouraged her to engage in regular exercise appropriate for her age and condition.  Problem List Items Addressed This Visit      Other   Avitaminosis D   Relevant Orders   VITAMIN D 25 Hydroxy (Vit-D Deficiency, Fractures)   Chronic fatigue   Relevant Orders   TSH   Comprehensive metabolic panel   CBC w/Diff/Platelet    Other Visit Diagnoses    Encounter for annual physical exam    -  Primary   Relevant Orders   TSH   Lipid panel   Comprehensive metabolic panel   CBC w/Diff/Platelet   Hepatitis C Antibody   VITAMIN D 25 Hydroxy (Vit-D Deficiency, Fractures)   Need for hepatitis C screening test       Relevant Orders   Hepatitis C Antibody   Vaccine counseling         - counseled on safety and efficacy of COVID19 vaccines -Encouraged her to proceed with COVID-19 vaccine as her  reaction from COVID-19 infection would likely be much more severe than from COVID-19 vaccination -Patient is pre-contemplative and declines vaccination  Return in about 1 year (around 06/15/2021) for CPE.     I, Shirlee Latch, MD, have reviewed all documentation for this visit. The documentation on 06/15/20 for the exam, diagnosis, procedures, and orders are all accurate and complete.   Tanya Cochran, Marzella Schlein, MD, MPH [facility]

## 2020-06-21 DIAGNOSIS — Z0000 Encounter for general adult medical examination without abnormal findings: Secondary | ICD-10-CM | POA: Diagnosis not present

## 2020-06-21 DIAGNOSIS — Z1159 Encounter for screening for other viral diseases: Secondary | ICD-10-CM | POA: Diagnosis not present

## 2020-06-21 DIAGNOSIS — R5382 Chronic fatigue, unspecified: Secondary | ICD-10-CM | POA: Diagnosis not present

## 2020-06-21 DIAGNOSIS — E785 Hyperlipidemia, unspecified: Secondary | ICD-10-CM | POA: Diagnosis not present

## 2020-06-22 LAB — LIPID PANEL
Chol/HDL Ratio: 4.1 ratio (ref 0.0–4.4)
Cholesterol, Total: 209 mg/dL — ABNORMAL HIGH (ref 100–199)
HDL: 51 mg/dL (ref >39)
LDL Chol Calc (NIH): 143 mg/dL — ABNORMAL HIGH (ref 0–99)
Triglycerides: 84 mg/dL (ref 0–149)
VLDL Cholesterol Cal: 15 mg/dL (ref 5–40)

## 2020-06-22 LAB — CBC WITH DIFFERENTIAL/PLATELET
Basophils Absolute: 0 10*3/uL (ref 0.0–0.2)
Basos: 1 %
EOS (ABSOLUTE): 0.1 10*3/uL (ref 0.0–0.4)
Eos: 1 %
Hematocrit: 42.7 % (ref 34.0–46.6)
Hemoglobin: 15.1 g/dL (ref 11.1–15.9)
Immature Grans (Abs): 0 10*3/uL (ref 0.0–0.1)
Immature Granulocytes: 0 %
Lymphocytes Absolute: 1.9 10*3/uL (ref 0.7–3.1)
Lymphs: 42 %
MCH: 32.8 pg (ref 26.6–33.0)
MCHC: 35.4 g/dL (ref 31.5–35.7)
MCV: 93 fL (ref 79–97)
Monocytes Absolute: 0.3 10*3/uL (ref 0.1–0.9)
Monocytes: 6 %
Neutrophils Absolute: 2.2 10*3/uL (ref 1.4–7.0)
Neutrophils: 50 %
Platelets: 269 10*3/uL (ref 150–450)
RBC: 4.6 x10E6/uL (ref 3.77–5.28)
RDW: 12.1 % (ref 11.7–15.4)
WBC: 4.6 10*3/uL (ref 3.4–10.8)

## 2020-06-22 LAB — COMPREHENSIVE METABOLIC PANEL
ALT: 13 IU/L (ref 0–32)
AST: 17 IU/L (ref 0–40)
Albumin/Globulin Ratio: 1.6 (ref 1.2–2.2)
Albumin: 4.2 g/dL (ref 3.8–4.8)
Alkaline Phosphatase: 62 IU/L (ref 44–121)
BUN/Creatinine Ratio: 13 (ref 9–23)
BUN: 12 mg/dL (ref 6–24)
Bilirubin Total: 0.4 mg/dL (ref 0.0–1.2)
CO2: 25 mmol/L (ref 20–29)
Calcium: 9.5 mg/dL (ref 8.7–10.2)
Chloride: 104 mmol/L (ref 96–106)
Creatinine, Ser: 0.96 mg/dL (ref 0.57–1.00)
GFR calc Af Amer: 80 mL/min/{1.73_m2} (ref >59)
GFR calc non Af Amer: 70 mL/min/{1.73_m2} (ref >59)
Globulin, Total: 2.6 g/dL (ref 1.5–4.5)
Glucose: 91 mg/dL (ref 65–99)
Potassium: 4 mmol/L (ref 3.5–5.2)
Sodium: 142 mmol/L (ref 134–144)
Total Protein: 6.8 g/dL (ref 6.0–8.5)

## 2020-06-22 LAB — TSH: TSH: 2.14 u[IU]/mL (ref 0.450–4.500)

## 2020-06-22 LAB — HEPATITIS C ANTIBODY: Hep C Virus Ab: <0.1 s/co ratio (ref 0.0–0.9)

## 2020-06-22 LAB — VITAMIN D 25 HYDROXY (VIT D DEFICIENCY, FRACTURES): Vit D, 25-Hydroxy: 27.3 ng/mL — ABNORMAL LOW (ref 30.0–100.0)

## 2020-06-23 ENCOUNTER — Telehealth: Payer: Self-pay

## 2020-06-23 ENCOUNTER — Encounter: Payer: Self-pay | Admitting: Family Medicine

## 2020-06-23 NOTE — Telephone Encounter (Signed)
Pt advised.   Thanks,   -Brant Peets  

## 2020-06-23 NOTE — Telephone Encounter (Signed)
-----   Message from Erasmo Downer, MD sent at 06/22/2020  8:59 AM EDT ----- Normal labs, except high cholesterol and low Vit D.  The 10-year ASCVD (heart disease/stroke) risk score Denman George DC Jr., et al., 2013) is: 0.8%, which is low. No indication for cholesterol-lowering medication at this time.  I recommend diet low in saturated fat and regular exercise - 30 min at least 5 times per week.

## 2020-07-23 DIAGNOSIS — E785 Hyperlipidemia, unspecified: Secondary | ICD-10-CM | POA: Insufficient documentation

## 2020-08-10 DIAGNOSIS — F3181 Bipolar II disorder: Secondary | ICD-10-CM | POA: Diagnosis not present

## 2020-08-10 DIAGNOSIS — Z79899 Other long term (current) drug therapy: Secondary | ICD-10-CM | POA: Diagnosis not present

## 2020-08-10 DIAGNOSIS — Z1389 Encounter for screening for other disorder: Secondary | ICD-10-CM | POA: Diagnosis not present

## 2020-08-16 DIAGNOSIS — M76822 Posterior tibial tendinitis, left leg: Secondary | ICD-10-CM | POA: Diagnosis not present

## 2020-10-25 DIAGNOSIS — U071 COVID-19: Secondary | ICD-10-CM | POA: Diagnosis not present

## 2020-10-29 DIAGNOSIS — U071 COVID-19: Secondary | ICD-10-CM

## 2020-10-29 HISTORY — DX: COVID-19: U07.1

## 2020-12-01 DIAGNOSIS — H5213 Myopia, bilateral: Secondary | ICD-10-CM | POA: Diagnosis not present

## 2020-12-23 ENCOUNTER — Other Ambulatory Visit (HOSPITAL_COMMUNITY)
Admission: RE | Admit: 2020-12-23 | Discharge: 2020-12-23 | Disposition: A | Discharge status: Home / Self Care | Payer: BC Managed Care – PPO | Source: Ambulatory Visit | Attending: Obstetrics and Gynecology | Admitting: Obstetrics and Gynecology

## 2020-12-23 ENCOUNTER — Other Ambulatory Visit: Payer: Self-pay

## 2020-12-23 ENCOUNTER — Ambulatory Visit: Payer: BC Managed Care – PPO | Admitting: Obstetrics and Gynecology

## 2020-12-23 VITALS — BP 107/67 | Ht 63.0 in | Wt 129.0 lb

## 2020-12-23 DIAGNOSIS — N882 Stricture and stenosis of cervix uteri: Secondary | ICD-10-CM

## 2020-12-23 DIAGNOSIS — R102 Pelvic and perineal pain: Secondary | ICD-10-CM | POA: Insufficient documentation

## 2020-12-23 NOTE — Progress Notes (Signed)
Obstetrics & Gynecology Office Visit   Chief Complaint  Patient presents with  . Pelvic Pain    History of Present Illness: 50 y.o. [id number] female who presents with pelvic pain that has been getting worse over the past three weeks.  The pain is located centrally in her lower, mid abdomen.  She also notes pain in her back.  She describes the pain as cramps.  The pain never goes away and does not vary.  She rates the pain as 5/10 (but she describes it as pretty bad).  Alleviating factors: nothing. Aggravating factors: intercourse.  Associated symptoms: bloating, swelling.  She just doesn't feel right.  She got COVID at the end of February.  Prior to this, she would have a period for two weeks and then go a short amount of time and then start again. Since having COVID she has had no periods.   Last pap smear 06/2019: NILM, HPV negative.  STD screen 12/2019 was negative.    Last ultrasound was 04/2018: Cystic changes in the endometrium.  Possible endocervical polyp Ovaries appeared normal per report.   Last pelvic exam showed a slightly stenotic cervix. A follow up ultrasound was ordered. However, due to COVID, the ultrasound was not obtained.    Past Medical History:  Diagnosis Date  . Bipolar disorder (HCC)   . Fibromyalgia   . Irritable bowel syndrome (IBS)     Past Surgical History:  Procedure Laterality Date  . AUGMENTATION MAMMAPLASTY  09/04/2008  . BREAST ENHANCEMENT SURGERY  2010  . laproscropic ovarion cyst  1999  . WISDOM TOOTH EXTRACTION  1995    Gynecologic History: No LMP recorded. (Menstrual status: Irregular Periods).  Obstetric History: [id number]  Family History  Problem Relation Age of Onset  . Diabetes Mother        non-insulin dependent  . Aortic aneurysm Mother   . Heart disease Father   . Atrial fibrillation Father   . ALS Maternal Grandmother   . Aortic aneurysm Maternal Grandfather   . Prostate cancer Maternal Grandfather        prostate  . Stroke  Paternal Grandmother   . Aortic aneurysm Paternal Grandfather   . Colon cancer Neg Hx   . Breast cancer Neg Hx   . Ovarian cancer Neg Hx     Social History   Socioeconomic History  . Marital status: Divorced    Spouse name: Not on file  . Number of children: Not on file  . Years of education: Not on file  . Highest education level: Not on file  Occupational History  . Not on file  Tobacco Use  . Smoking status: Never Smoker  . Smokeless tobacco: Never Used  Vaping Use  . Vaping Use: Never used  Substance and Sexual Activity  . Alcohol use: Yes    Alcohol/week: 0.0 standard drinks    Comment: occasional  . Drug use: No  . Sexual activity: Yes    Birth control/protection: Surgical    Comment: thoughts of vasectomy  Other Topics Concern  . Not on file  Social History Narrative  . Not on file   Social Determinants of Health   Financial Resource Strain: Not on file  Food Insecurity: Not on file  Transportation Needs: Not on file  Physical Activity: Not on file  Stress: Not on file  Social Connections: Not on file  Intimate Partner Violence: Not on file    No Known Allergies  Prior to Admission medications   Medication Sig  Start Date End Date Taking? Authorizing Provider  lamoTRIgine (LAMICTAL) 100 MG tablet Take by mouth.   Yes [provider]  QUEtiapine (SEROQUEL) 100 MG tablet Take by mouth.   Yes [provider]    Review of Systems  Constitutional: Negative.   HENT: Negative.   Eyes: Negative.   Respiratory: Negative.   Cardiovascular: Negative.   Gastrointestinal: Negative.   Genitourinary: Negative.   Musculoskeletal: Negative.   Skin: Negative.   Neurological: Negative.   Psychiatric/Behavioral: Negative.      Physical Exam BP 107/67   Ht 5\' 3"  (1.6 m)   Wt 129 lb (58.5 kg)   BMI 22.85 kg/m  No LMP recorded. (Menstrual status: Irregular Periods). Physical Exam Constitutional:      General: She is not in acute distress.     Appearance: Normal appearance. She is well-developed.  Genitourinary:     Vulva, bladder and urethral meatus normal.     No lesions in the vagina.     Right Labia: No rash, tenderness, lesions, skin changes or Bartholin's cyst.    Left Labia: No tenderness, lesions, skin changes, Bartholin's cyst or rash.    No inguinal adenopathy present in the right or left side.    Pelvic Tanner Score: 5/5.    No vaginal discharge, erythema, bleeding or ulceration.     No vaginal prolapse present.     Right Adnexa: not tender, not full and no mass present.    Left Adnexa: not tender, not full and no mass present.    No cervical motion tenderness, discharge, friability, lesion or polyp.     Cervical exam comments: +stenosis.     Uterus is tender (directly over uterus).     Uterus is not enlarged or fixed.     Uterus is anteverted.     No urethral tenderness or mass present.     Pelvic exam was performed with patient in the lithotomy position.  HENT:     Head: Normocephalic and atraumatic.  Eyes:     General: No scleral icterus.    Conjunctiva/sclera: Conjunctivae normal.  Cardiovascular:     Rate and Rhythm: Normal rate and regular rhythm.     Heart sounds: No murmur heard. No friction rub. No gallop.   Pulmonary:     Effort: Pulmonary effort is normal. No respiratory distress.     Breath sounds: Normal breath sounds. No wheezing or rales.  Abdominal:     General: Bowel sounds are normal. There is no distension.     Palpations: Abdomen is soft. There is no mass.     Tenderness: There is no abdominal tenderness. There is no guarding or rebound.     Hernia: There is no hernia in the left inguinal area or right inguinal area.  Musculoskeletal:        General: Normal range of motion.     Cervical back: Normal range of motion and neck supple.  Lymphadenopathy:     Lower Body: No right inguinal adenopathy. No left inguinal adenopathy.  Neurological:     General: No focal deficit present.      Mental Status: She is alert and oriented to person, place, and time.     Cranial Nerves: No cranial nerve deficit.  Skin:    General: Skin is warm and dry.     Findings: No erythema.  Psychiatric:        Mood and Affect: Mood normal.        Behavior: Behavior normal.  Judgment: Judgment normal.    Female chaperone present for pelvic and breast  portions of the physical exam  Assessment: 50 y.o. [id number] female here for  1. Pelvic pain   2. Cervical stenosis (uterine cervix)      Plan: Problem List Items Addressed This Visit   None   Visit Diagnoses    Pelvic pain    -  Primary   Relevant Orders   US PELVIC COMPLETE WITH TRANSVAGINAL (Completed)   Cytology - PAP   Cervical stenosis (uterine cervix)       Relevant Orders   US PELVIC COMPLETE WITH TRANSVAGINAL (Completed)   Cytology - PAP     I am concerned that given her cervical stenosis and her current level of pain, she may have hematometra.  She has a prior ultrasound that shows what is likely of cervical polyp.  This polyp is located more in the endocervical area.  This has never been treated.  She has a history of normal Pap smears.  She does also have large nabothian cysts on ultrasound, which may contribute to her stenosis of her cervix and disallowing of drainage of uterine contents vaginally.  I will order a ultrasound for as soon as I can get one.  We discussed treatment options that may be necessary, including; surgery with dilation and curettage with hysteroscopy versus hysterectomy..  She has had no endometrial sampling due to her cervical stenosis.  This will need to be accomplished at some point prior to surgery.  However it may not be possible given the severity of her stenosis. Will follow up via telephone after she has her ultrasound.   Other considerations would be endometritis, malignancy, other organ system pain (UTI, e.g.)  A total of 32 minutes were spent face-to-face with the patient as well as  preparation, review, communication, and documentation during this encounter.     Thomasene Mohair, MD 12/23/2020 4:06 PM

## 2020-12-24 ENCOUNTER — Telehealth: Payer: Self-pay

## 2020-12-24 NOTE — Telephone Encounter (Signed)
Pt calling; was seen yesterday; is asking for pain medication in the meantime as advil is like eating M&Ms.  [phone]

## 2020-12-27 ENCOUNTER — Telehealth: Payer: Self-pay | Admitting: Obstetrics and Gynecology

## 2020-12-27 ENCOUNTER — Ambulatory Visit
Admission: RE | Admit: 2020-12-27 | Discharge: 2020-12-27 | Disposition: A | Discharge status: Home / Self Care | Payer: BC Managed Care – PPO | Source: Ambulatory Visit | Attending: Obstetrics and Gynecology | Admitting: Obstetrics and Gynecology

## 2020-12-27 ENCOUNTER — Other Ambulatory Visit: Payer: Self-pay

## 2020-12-27 DIAGNOSIS — R102 Pelvic and perineal pain: Secondary | ICD-10-CM | POA: Diagnosis not present

## 2020-12-27 DIAGNOSIS — N888 Other specified noninflammatory disorders of cervix uteri: Secondary | ICD-10-CM | POA: Diagnosis not present

## 2020-12-27 DIAGNOSIS — N882 Stricture and stenosis of cervix uteri: Secondary | ICD-10-CM | POA: Diagnosis not present

## 2020-12-27 NOTE — Telephone Encounter (Signed)
Discussed u/s findings. SHe had some bleeding Friday and did have significant relief of her symptoms. This leads me to believe that the combination of her lower uterine segment/cervical polyp PLUS her cervical stenosis (which doesn't appear to be complete) is causing her pain and bleeding pattern.  Discussed that we could do nothing and the risks of this. We discussed hysteroscopy, D&C, with cervical dilation.  The benefit of this is a smaller surgery. Though the risk would be recurrence of cervical stenosis and possibly coming back to this point.  We also discussed hysterectomy, which would solve the problem. We discussed that this is a bigger procedure and that it carries more risks. My plan would be to not take her ovaries. So, she would not immediately become menopausal.   All questions answered. She will consider and let me know.

## 2020-12-28 ENCOUNTER — Ambulatory Visit: Payer: BC Managed Care – PPO | Admitting: Obstetrics and Gynecology

## 2020-12-28 ENCOUNTER — Encounter: Payer: Self-pay | Admitting: Obstetrics and Gynecology

## 2020-12-28 VITALS — BP 124/74 | Wt 130.0 lb

## 2020-12-28 DIAGNOSIS — N921 Excessive and frequent menstruation with irregular cycle: Secondary | ICD-10-CM

## 2020-12-28 DIAGNOSIS — N841 Polyp of cervix uteri: Secondary | ICD-10-CM

## 2020-12-28 DIAGNOSIS — R102 Pelvic and perineal pain: Secondary | ICD-10-CM | POA: Diagnosis not present

## 2020-12-28 DIAGNOSIS — N882 Stricture and stenosis of cervix uteri: Secondary | ICD-10-CM

## 2020-12-28 NOTE — Progress Notes (Signed)
Obstetrics & Gynecology Office Visit   Chief Complaint:  Chief Complaint  Patient presents with  . Consult    History of Present Illness: 50 y.o. [id number] female who presents in follow up to discuss surgery further. She has more questions regarding, D&C with hysteroscopy and cervical polypectomy vs Total Laparoscopic Hysterectomy, bilateral salpingectomy.    Past Medical History:  Diagnosis Date  . Bipolar disorder (HCC)   . Fibromyalgia   . Irritable bowel syndrome (IBS)     Past Surgical History:  Procedure Laterality Date  . AUGMENTATION MAMMAPLASTY  09/04/2008  . BREAST ENHANCEMENT SURGERY  2010  . laproscropic ovarion cyst  1999  . WISDOM TOOTH EXTRACTION  1995    Gynecologic History: No LMP recorded. (Menstrual status: Irregular Periods).  Obstetric History: [id number]  Family History  Problem Relation Age of Onset  . Diabetes Mother        non-insulin dependent  . Aortic aneurysm Mother   . Heart disease Father   . Atrial fibrillation Father   . ALS Maternal Grandmother   . Aortic aneurysm Maternal Grandfather   . Prostate cancer Maternal Grandfather        prostate  . Stroke Paternal Grandmother   . Aortic aneurysm Paternal Grandfather   . Colon cancer Neg Hx   . Breast cancer Neg Hx   . Ovarian cancer Neg Hx     Social History   Socioeconomic History  . Marital status: Divorced    Spouse name: Not on file  . Number of children: Not on file  . Years of education: Not on file  . Highest education level: Not on file  Occupational History  . Not on file  Tobacco Use  . Smoking status: Never Smoker  . Smokeless tobacco: Never Used  Vaping Use  . Vaping Use: Never used  Substance and Sexual Activity  . Alcohol use: Yes    Alcohol/week: 0.0 standard drinks    Comment: occasional  . Drug use: No  . Sexual activity: Yes    Birth control/protection: Surgical    Comment: thoughts of vasectomy  Other Topics Concern  . Not on file  Social History  Narrative  . Not on file   Social Determinants of Health   Financial Resource Strain: Not on file  Food Insecurity: Not on file  Transportation Needs: Not on file  Physical Activity: Not on file  Stress: Not on file  Social Connections: Not on file  Intimate Partner Violence: Not on file    No Known Allergies  Prior to Admission medications   Medication Sig Start Date End Date Taking? Authorizing Provider  lamoTRIgine (LAMICTAL) 100 MG tablet Take by mouth.    [provider]  lamoTRIgine (LAMICTAL) 200 MG tablet Take 200 mg by mouth at bedtime. Patient not taking: Reported on 12/23/2020 06/06/15   [provider]  QUEtiapine (SEROQUEL) 100 MG tablet Take by mouth.    [provider]  QUEtiapine Fumarate (SEROQUEL XR) 150 MG 24 hr tablet  04/15/18   [provider]    Review of Systems  Constitutional: Negative.   HENT: Negative.   Eyes: Negative.   Respiratory: Negative.   Cardiovascular: Negative.   Gastrointestinal: Negative.   Genitourinary: Negative.   Musculoskeletal: Negative.   Skin: Negative.   Neurological: Negative.   Psychiatric/Behavioral: Negative.      Physical Exam BP 124/74   Wt 130 lb (59 kg)   BMI 23.03 kg/m  No LMP recorded. (Menstrual status: Irregular Periods).  Physical Exam Constitutional:      General: She is not in acute distress.    Appearance: Normal appearance.  HENT:     Head: Normocephalic and atraumatic.  Eyes:     General: No scleral icterus.    Conjunctiva/sclera: Conjunctivae normal.  Neurological:     General: No focal deficit present.     Mental Status: She is alert and oriented to person, place, and time.     Cranial Nerves: No cranial nerve deficit.  Psychiatric:        Mood and Affect: Mood normal.        Behavior: Behavior normal.        Judgment: Judgment normal.     Female chaperone present for pelvic and breast  portions of the physical exam  Assessment: 50 y.o. [id number] female  here for  1. Pelvic pain   2. Cervical stenosis (uterine cervix)   3. Cervical polyp   4. Menorrhagia with irregular cycle      Plan: Problem List Items Addressed This Visit   None   Visit Diagnoses    Pelvic pain    -  Primary   Cervical stenosis (uterine cervix)       Cervical polyp       Menorrhagia with irregular cycle         Long discussion again held with very reasonable questions regarding appropriate choice. She has fairly significant cervical stenosis. She appears to have an endocervical polyp. She has pain and prolonged menses (likely due to her stenotic cervix).  After discussing the risks/benefits and pros and cons of the surgical approaches, she elects to proceed with TLH/BS.  I agree with this approach, given her circumstances.  Will schedule for soon. We did discussed that I would have to perform this surgery without the aid of an endometrial biopsy and that, while low, her risk of cancer is not zero. The optimal approach would be enlist a gynecologic oncologist if cancer were suspected. In her case, I do believe there is ample information to explain her symptoms without a cancer diagnosis.  So, will proceed in this way.   A total of 23 minutes were spent face-to-face with the patient as well as preparation, review, communication, and documentation during this encounter.    Thomasene Mohair, MD 12/28/2020 8:48 PM

## 2020-12-29 LAB — CYTOLOGY - PAP
Adequacy: ABSENT
Comment: NEGATIVE
Diagnosis: NEGATIVE
High risk HPV: NEGATIVE

## 2020-12-30 ENCOUNTER — Encounter: Payer: Self-pay | Admitting: Obstetrics and Gynecology

## 2020-12-30 ENCOUNTER — Telehealth: Payer: Self-pay

## 2020-12-30 NOTE — Telephone Encounter (Signed)
-----   Message from Conard Novak, MD sent at 12/28/2020  5:54 PM EDT ----- Regarding: Schedule Surgery Surgery Booking Request Patient Full Name:  Tanya Cochran  MRN: [id number]  DOB: Mar 03, 1971  Surgeon: Thomasene Mohair, MD  Requested Surgery Date and Time: TBD Primary Diagnosis AND Code:  1) Pelvic Pain [R10.2] 2) Menorrhagia with irregular cycle [N92.1] 3) Cervical stenosis [N88.2] 4) Cervical polyp [N84.1]  Secondary Diagnosis and Code:  Surgical Procedure:  1) Total Laparoscopic Hysterectomy, bilateral salpingectomy 2) Cystoscopy   RNFA Requested?: No L&D Notification: No Admission Status: same day surgery Length of Surgery: 125 min Special Case Needs: No H&P: Yes Phone Interview???:  Yes Interpreter: No Medical Clearance:  No Special Scheduling Instructions: No Any known health/anesthesia issues, diabetes, sleep apnea, latex allergy, defibrillator/pacemaker?: No Acuity: P3   (P1 highest, P2 delay may cause harm, P3 low, elective gyn, P4 lowest)

## 2020-12-30 NOTE — Telephone Encounter (Signed)
Called patient to schedule TLH/BS, Cystoscopy w Britt Boozer 5/12  H&P 5/2 @ 3:10   Covid testing 5/10 @ [facility], [address]. Advised pt to mask until DOS.  Pre-admit phone call appointment to be requested - date and time will be included on H&P paper work. Also all appointments will be updated on pt MyChart. Explained that this appointment has a call window. Based on the time scheduled will indicate if the call will be received within a 4 hour window before 1:00 or after.  Advised that pt may also receive calls from the hospital pharmacy and pre-service center.  Confirmed pt has [employer] as Editor, commissioning. No secondary insurance.

## 2021-01-03 ENCOUNTER — Encounter: Payer: Self-pay | Admitting: Obstetrics and Gynecology

## 2021-01-03 ENCOUNTER — Other Ambulatory Visit: Payer: Self-pay

## 2021-01-03 ENCOUNTER — Ambulatory Visit (INDEPENDENT_AMBULATORY_CARE_PROVIDER_SITE_OTHER): Payer: BC Managed Care – PPO | Admitting: Obstetrics and Gynecology

## 2021-01-03 VITALS — BP 122/70 | Ht 63.0 in | Wt 128.0 lb

## 2021-01-03 DIAGNOSIS — N921 Excessive and frequent menstruation with irregular cycle: Secondary | ICD-10-CM

## 2021-01-03 DIAGNOSIS — N882 Stricture and stenosis of cervix uteri: Secondary | ICD-10-CM

## 2021-01-03 DIAGNOSIS — R102 Pelvic and perineal pain: Secondary | ICD-10-CM

## 2021-01-03 DIAGNOSIS — N841 Polyp of cervix uteri: Secondary | ICD-10-CM

## 2021-01-03 NOTE — H&P (View-Only) (Signed)
Preoperative History and Physical  Tanya Cochran is a 50 y.o. [id number] here for surgical management of menorrhagia with irregular cycle, endocervical polyp, cervical stenosis, pelvic pain.   No significant preoperative concerns.  History of Present Illness: 50 y.o. G63P2012 female who presents with pelvic pain that has been getting worse over the past three weeks.  The pain is located centrally in her lower, mid abdomen.  She also notes pain in her back.  She describes the pain as cramps.  The pain never goes away and does not vary.  She rates the pain as 5/10 (but she describes it as pretty bad).  Alleviating factors: nothing. Aggravating factors: intercourse.  Associated symptoms: bloating, swelling.  She just doesn't feel right.  She got COVID at the end of February.  Prior to this, she would have a period for two weeks and then go a short amount of time and then start again. Since having COVID she has had no periods.   Last pap smear 06/2019: NILM, HPV negative.  STD screen 12/2019 was negative.    Last ultrasound was 12/2020: Uterus  Measurements: 10.0 x 4.4 x 5.6 cm = volume: 128 mL. No fibroids or other mass visualized.  Endometrium  Thickness: 8 mm. Cystic changes are again seen in the endometrium in the lower uterine segment with nabothian cysts at the level of the cervix. There is a small amount of fluid in the lower uterine endometrial canal/endocervical canal surrounding a 1.2 x 0.6 x 0.9 cm intermediate echogenicity soft tissue focus with internal vascularity which may represent a polyp.  Right ovary  Measurements: 2.8 x 2.2 x 2.4 cm = volume: 7 mL. Normal appearance/no adnexal mass.  Left ovary  Measurements: 2.9 x 2.3 x 2.2 cm = volume: 8 mL. Normal appearance/no adnexal mass.  Other findings  No abnormal free fluid.  Last pelvic exam showed a stenotic cervix. A follow up ultrasound was ordered. However, due to COVID, the ultrasound was not obtained.    Last  pap smear: 12/23/20 - NILM, HPV negative Endometrial biopsy: unable to perform due to cervical stenosis  Proposed surgery: Total Laparoscopic Hysterectomy, bilateral salpingectomy, cystoscopy  Past Medical History:  Diagnosis Date  . Bipolar disorder (HCC)   . Fibromyalgia   . Irritable bowel syndrome (IBS)    Past Surgical History:  Procedure Laterality Date  . AUGMENTATION MAMMAPLASTY  09/04/2008  . BREAST ENHANCEMENT SURGERY  2010  . laproscropic ovarion cyst  1999  . WISDOM TOOTH EXTRACTION  1995   OB History  Gravida Para Term Preterm AB Living  3 2 2   1 2   SAB IAB Ectopic Multiple Live Births      1        # Outcome Date GA Lbr Len/2nd Weight Sex Delivery Anes PTL Lv  3 Ectopic           2 Term           1 Term           Patient denies any other pertinent gynecologic issues.   Current Outpatient Medications on File Prior to Visit  Medication Sig Dispense Refill  . cholecalciferol (VITAMIN D) 25 MCG (1000 UNIT) tablet Take 1,000 Units by mouth daily.    lamoTRIgine (LAMICTAL) 200 MG tablet Take 200 mg by mouth at bedtime.    Marland Kitchen loratadine (CLARITIN) 10 MG tablet Take 10 mg by mouth daily.    . Multiple Vitamins-Minerals (MULTIVITAMIN WITH MINERALS) tablet Take 1 tablet  by mouth daily. Woman    . QUEtiapine Fumarate (SEROQUEL XR) 150 MG 24 hr tablet Take by mouth at bedtime.     No current facility-administered medications on file prior to visit.   No Known Allergies  Social History:   reports that she has never smoked. She has never used smokeless tobacco. She reports current alcohol use. She reports that she does not use drugs.  Family History  Problem Relation Age of Onset  . Diabetes Mother        non-insulin dependent  . Aortic aneurysm Mother   . Heart disease Father   . Atrial fibrillation Father   . ALS Maternal Grandmother   . Aortic aneurysm Maternal Grandfather   . Prostate cancer Maternal Grandfather        prostate  . Stroke Paternal  Grandmother   . Aortic aneurysm Paternal Grandfather   . Colon cancer Neg Hx   . Breast cancer Neg Hx   . Ovarian cancer Neg Hx     Review of Systems: Noncontributory  PHYSICAL EXAM: Blood pressure 122/70, height 5\' 3"  (1.6 m), weight 128 lb (58.1 kg). CONSTITUTIONAL: Well-developed, well-nourished female in no acute distress.  HENT:  Normocephalic, atraumatic, External right and left ear normal. Oropharynx is clear and moist EYES: Conjunctivae and EOM are normal. Pupils are equal, round, and reactive to light. No scleral icterus.  NECK: Normal range of motion, supple, no masses SKIN: Skin is warm and dry. No rash noted. Not diaphoretic. No erythema. No pallor. NEUROLGIC: Alert and oriented to person, place, and time. Normal reflexes, muscle tone coordination. No cranial nerve deficit noted. PSYCHIATRIC: Normal mood and affect. Normal behavior. Normal judgment and thought content. CARDIOVASCULAR: Normal heart rate noted, regular rhythm RESPIRATORY: Effort and breath sounds normal, no problems with respiration noted ABDOMEN: Soft, nontender, nondistended. PELVIC: Deferred MUSCULOSKELETAL: Normal range of motion. No edema and no tenderness. 2+ distal pulses.  Labs: Results for orders placed or performed in visit on 12/23/20 (from the past 336 hour(s))  Cytology - PAP   Collection Time: 12/23/20  4:31 PM  Result Value Ref Range   High risk HPV Negative    Adequacy      Satisfactory for evaluation; transformation zone component ABSENT.   Diagnosis      - Negative for intraepithelial lesion or malignancy (NILM)   Comment Normal Reference Range HPV - Negative     Imaging Studies: 12/25/20 PELVIC COMPLETE WITH TRANSVAGINAL  Result Date: 12/27/2020 CLINICAL DATA:  Pelvic pain for 2-3 weeks. Cervical stenosis. Nabothian cysts and possible endocervical polyp on prior ultrasound. EXAM: TRANSABDOMINAL AND TRANSVAGINAL ULTRASOUND OF PELVIS TECHNIQUE: Both transabdominal and transvaginal  ultrasound examinations of the pelvis were performed. Transabdominal technique was performed for global imaging of the pelvis including uterus, ovaries, adnexal regions, and pelvic cul-de-sac. It was necessary to proceed with endovaginal exam following the transabdominal exam to visualize the endometrium. COMPARISON:  04/17/2018 FINDINGS: Uterus Measurements: 10.0 x 4.4 x 5.6 cm = volume: 128 mL. No fibroids or other mass visualized. Endometrium Thickness: 8 mm. Cystic changes are again seen in the endometrium in the lower uterine segment with nabothian cysts at the level of the cervix. There is a small amount of fluid in the lower uterine endometrial canal/endocervical canal surrounding a 1.2 x 0.6 x 0.9 cm intermediate echogenicity soft tissue focus with internal vascularity which may represent a polyp. Right ovary Measurements: 2.8 x 2.2 x 2.4 cm = volume: 7 mL. Normal appearance/no adnexal mass. Left ovary Measurements:  2.9 x 2.3 x 2.2 cm = volume: 8 mL. Normal appearance/no adnexal mass. Other findings No abnormal free fluid. IMPRESSION: 1. Chronic cystic changes in the endometrium with possible 1.2 cm polyp in the lower uterus/endocervical canal. 2. Unremarkable appearance of the ovaries. Electronically Signed   By: Sebastian Ache M.D.   On: 12/27/2020 08:54    Assessment:   ICD-10-CM   1. Menorrhagia with irregular cycle  N92.1   2. Cervical polyp  N84.1   3. Cervical stenosis (uterine cervix)  N88.2   4. Pelvic pain  R10.2      Plan: Patient will undergo surgical management with the above surgery.   The risks of surgery were discussed in detail with the patient including but not limited to: bleeding which may require transfusion or reoperation; infection which may require antibiotics; injury to surrounding organs which may involve bowel, bladder, ureters ; need for additional procedures including laparoscopy or laparotomy; thromboembolic phenomenon, surgical site problems and other  postoperative/anesthesia complications. Likelihood of success in alleviating the patient's condition was discussed. Routine postoperative instructions will be reviewed with the patient and her family in detail after surgery.  The patient concurred with the proposed plan, giving informed written consent for the surgery.    Preoperative prophylactic antibiotics, as indicated, and SCDs ordered on call to the OR.    Thomasene Mohair, MD 01/03/2021 4:04 PM

## 2021-01-03 NOTE — Progress Notes (Signed)
Preoperative History and Physical  Tanya Cochran is a 50 y.o. P5K9326 here for surgical management of menorrhagia with irregular cycle, endocervical polyp, cervical stenosis, pelvic pain.   No significant preoperative concerns.  History of Present Illness: 50 y.o. G63P2012 female who presents with pelvic pain that has been getting worse over the past three weeks.  The pain is located centrally in her lower, mid abdomen.  She also notes pain in her back.  She describes the pain as cramps.  The pain never goes away and does not vary.  She rates the pain as 5/10 (but she describes it as pretty bad).  Alleviating factors: nothing. Aggravating factors: intercourse.  Associated symptoms: bloating, swelling.  She just doesn't feel right.  She got COVID at the end of February.  Prior to this, she would have a period for two weeks and then go a short amount of time and then start again. Since having COVID she has had no periods.   Last pap smear 06/2019: NILM, HPV negative.  STD screen 12/2019 was negative.    Last ultrasound was 12/2020: Uterus  Measurements: 10.0 x 4.4 x 5.6 cm = volume: 128 mL. No fibroids or other mass visualized.  Endometrium  Thickness: 8 mm. Cystic changes are again seen in the endometrium in the lower uterine segment with nabothian cysts at the level of the cervix. There is a small amount of fluid in the lower uterine endometrial canal/endocervical canal surrounding a 1.2 x 0.6 x 0.9 cm intermediate echogenicity soft tissue focus with internal vascularity which may represent a polyp.  Right ovary  Measurements: 2.8 x 2.2 x 2.4 cm = volume: 7 mL. Normal appearance/no adnexal mass.  Left ovary  Measurements: 2.9 x 2.3 x 2.2 cm = volume: 8 mL. Normal appearance/no adnexal mass.  Other findings  No abnormal free fluid.  Last pelvic exam showed a stenotic cervix. A follow up ultrasound was ordered. However, due to COVID, the ultrasound was not obtained.    Last  pap smear: 12/23/20 - NILM, HPV negative Endometrial biopsy: unable to perform due to cervical stenosis  Proposed surgery: Total Laparoscopic Hysterectomy, bilateral salpingectomy, cystoscopy  Past Medical History:  Diagnosis Date  . Bipolar disorder (HCC)   . Fibromyalgia   . Irritable bowel syndrome (IBS)    Past Surgical History:  Procedure Laterality Date  . AUGMENTATION MAMMAPLASTY  09/04/2008  . BREAST ENHANCEMENT SURGERY  2010  . laproscropic ovarion cyst  1999  . WISDOM TOOTH EXTRACTION  1995   OB History  Gravida Para Term Preterm AB Living  3 2 2   1 2   SAB IAB Ectopic Multiple Live Births      1        # Outcome Date GA Lbr Len/2nd Weight Sex Delivery Anes PTL Lv  3 Ectopic           2 Term           1 Term           Patient denies any other pertinent gynecologic issues.   Current Outpatient Medications on File Prior to Visit  Medication Sig Dispense Refill  . cholecalciferol (VITAMIN D) 25 MCG (1000 UNIT) tablet Take 1,000 Units by mouth daily.    lamoTRIgine (LAMICTAL) 200 MG tablet Take 200 mg by mouth at bedtime.    Marland Kitchen loratadine (CLARITIN) 10 MG tablet Take 10 mg by mouth daily.    . Multiple Vitamins-Minerals (MULTIVITAMIN WITH MINERALS) tablet Take 1 tablet  by mouth daily. Woman    . QUEtiapine Fumarate (SEROQUEL XR) 150 MG 24 hr tablet Take by mouth at bedtime.     No current facility-administered medications on file prior to visit.   No Known Allergies  Social History:   reports that she has never smoked. She has never used smokeless tobacco. She reports current alcohol use. She reports that she does not use drugs.  Family History  Problem Relation Age of Onset  . Diabetes Mother        non-insulin dependent  . Aortic aneurysm Mother   . Heart disease Father   . Atrial fibrillation Father   . ALS Maternal Grandmother   . Aortic aneurysm Maternal Grandfather   . Prostate cancer Maternal Grandfather        prostate  . Stroke Paternal  Grandmother   . Aortic aneurysm Paternal Grandfather   . Colon cancer Neg Hx   . Breast cancer Neg Hx   . Ovarian cancer Neg Hx     Review of Systems: Noncontributory  PHYSICAL EXAM: Blood pressure 122/70, height 5' 3" (1.6 m), weight 128 lb (58.1 kg). CONSTITUTIONAL: Well-developed, well-nourished female in no acute distress.  HENT:  Normocephalic, atraumatic, External right and left ear normal. Oropharynx is clear and moist EYES: Conjunctivae and EOM are normal. Pupils are equal, round, and reactive to light. No scleral icterus.  NECK: Normal range of motion, supple, no masses SKIN: Skin is warm and dry. No rash noted. Not diaphoretic. No erythema. No pallor. NEUROLGIC: Alert and oriented to person, place, and time. Normal reflexes, muscle tone coordination. No cranial nerve deficit noted. PSYCHIATRIC: Normal mood and affect. Normal behavior. Normal judgment and thought content. CARDIOVASCULAR: Normal heart rate noted, regular rhythm RESPIRATORY: Effort and breath sounds normal, no problems with respiration noted ABDOMEN: Soft, nontender, nondistended. PELVIC: Deferred MUSCULOSKELETAL: Normal range of motion. No edema and no tenderness. 2+ distal pulses.  Labs: Results for orders placed or performed in visit on 12/23/20 (from the past 336 hour(s))  Cytology - PAP   Collection Time: 12/23/20  4:31 PM  Result Value Ref Range   High risk HPV Negative    Adequacy      Satisfactory for evaluation; transformation zone component ABSENT.   Diagnosis      - Negative for intraepithelial lesion or malignancy (NILM)   Comment Normal Reference Range HPV - Negative     Imaging Studies: US PELVIC COMPLETE WITH TRANSVAGINAL  Result Date: 12/27/2020 CLINICAL DATA:  Pelvic pain for 2-3 weeks. Cervical stenosis. Nabothian cysts and possible endocervical polyp on prior ultrasound. EXAM: TRANSABDOMINAL AND TRANSVAGINAL ULTRASOUND OF PELVIS TECHNIQUE: Both transabdominal and transvaginal  ultrasound examinations of the pelvis were performed. Transabdominal technique was performed for global imaging of the pelvis including uterus, ovaries, adnexal regions, and pelvic cul-de-sac. It was necessary to proceed with endovaginal exam following the transabdominal exam to visualize the endometrium. COMPARISON:  04/17/2018 FINDINGS: Uterus Measurements: 10.0 x 4.4 x 5.6 cm = volume: 128 mL. No fibroids or other mass visualized. Endometrium Thickness: 8 mm. Cystic changes are again seen in the endometrium in the lower uterine segment with nabothian cysts at the level of the cervix. There is a small amount of fluid in the lower uterine endometrial canal/endocervical canal surrounding a 1.2 x 0.6 x 0.9 cm intermediate echogenicity soft tissue focus with internal vascularity which may represent a polyp. Right ovary Measurements: 2.8 x 2.2 x 2.4 cm = volume: 7 mL. Normal appearance/no adnexal mass. Left ovary Measurements:   2.9 x 2.3 x 2.2 cm = volume: 8 mL. Normal appearance/no adnexal mass. Other findings No abnormal free fluid. IMPRESSION: 1. Chronic cystic changes in the endometrium with possible 1.2 cm polyp in the lower uterus/endocervical canal. 2. Unremarkable appearance of the ovaries. Electronically Signed   By: Sebastian Ache M.D.   On: 12/27/2020 08:54    Assessment:   ICD-10-CM   1. Menorrhagia with irregular cycle  N92.1   2. Cervical polyp  N84.1   3. Cervical stenosis (uterine cervix)  N88.2   4. Pelvic pain  R10.2      Plan: Patient will undergo surgical management with the above surgery.   The risks of surgery were discussed in detail with the patient including but not limited to: bleeding which may require transfusion or reoperation; infection which may require antibiotics; injury to surrounding organs which may involve bowel, bladder, ureters ; need for additional procedures including laparoscopy or laparotomy; thromboembolic phenomenon, surgical site problems and other  postoperative/anesthesia complications. Likelihood of success in alleviating the patient's condition was discussed. Routine postoperative instructions will be reviewed with the patient and her family in detail after surgery.  The patient concurred with the proposed plan, giving informed written consent for the surgery.    Preoperative prophylactic antibiotics, as indicated, and SCDs ordered on call to the OR.    Thomasene Mohair, MD 01/03/2021 4:04 PM

## 2021-01-05 ENCOUNTER — Encounter
Admission: RE | Admit: 2021-01-05 | Discharge: 2021-01-05 | Disposition: A | Discharge status: Home / Self Care | Payer: BC Managed Care – PPO | Source: Ambulatory Visit | Attending: Obstetrics and Gynecology | Admitting: Obstetrics and Gynecology

## 2021-01-05 ENCOUNTER — Other Ambulatory Visit: Payer: Self-pay

## 2021-01-05 DIAGNOSIS — Z01812 Encounter for preprocedural laboratory examination: Secondary | ICD-10-CM | POA: Insufficient documentation

## 2021-01-05 HISTORY — DX: Personal history of Methicillin resistant Staphylococcus aureus infection: Z86.14

## 2021-01-05 NOTE — Telephone Encounter (Signed)
Pt has been seen since

## 2021-01-05 NOTE — Patient Instructions (Addendum)
Your procedure is scheduled on:01-13-21 THURSDAY Report to the Registration Desk on the 1st floor of the [facility]-Then proceed to the 2nd floor Surgery Desk in the [facility] To find out your arrival time, please call [phone] between 1PM - 3PM on:01-12-21 WEDNESDAY  REMEMBER: Instructions that are not followed completely may result in serious medical risk, up to and including death; or upon the discretion of your surgeon and anesthesiologist your surgery may need to be rescheduled.  Do not eat food after midnight the night before surgery.  No gum chewing, lozengers or hard candies.  You may however, drink CLEAR liquids up to 2 hours before you are scheduled to arrive for your surgery. Do not drink anything within 2 hours of your scheduled arrival time.  Clear liquids include: - water  - apple juice without pulp - gatorade (not RED, PURPLE, OR BLUE) - black coffee or tea (Do NOT add milk or creamers to the coffee or tea) Do NOT drink anything that is not on this list.  In addition, your doctor has ordered for you to drink the provided  Ensure Pre-Surgery Clear Carbohydrate Drink  Drinking this carbohydrate drink up to two hours before surgery helps to reduce insulin resistance and improve patient outcomes. Please complete drinking 2 hours prior to scheduled arrival time.  TAKE THESE MEDICATIONS THE MORNING OF SURGERY WITH A SIP OF WATER: -CLARITIN (LORATADINE)  One week prior to surgery: Stop Anti-inflammatories (NSAIDS) such as Advil, Aleve, Ibuprofen, Motrin, Naproxen, Naprosyn and Aspirin based products such as Excedrin, Goodys Powder, BC Powder-OK TO TAKE TYLENOL IF NEEDED  Stop ANY OVER THE COUNTER supplements/vitamins until after surgery-STOP YOUR MULTIVITAMIN AND VITAMIN D NOW (01-05-21)  No Alcohol for 24 hours before or after surgery.  No Smoking including e-cigarettes for 24 hours prior to surgery.  No chewable tobacco products for at least 6 hours prior to  surgery.  No nicotine patches on the day of surgery.  Do not use any "recreational" drugs for at least a week prior to your surgery.  Please be advised that the combination of cocaine and anesthesia may have negative outcomes, up to and including death. If you test positive for cocaine, your surgery will be cancelled.  On the morning of surgery brush your teeth with toothpaste and water, you may rinse your mouth with mouthwash if you wish. Do not swallow any toothpaste or mouthwash.  Do not wear jewelry, make-up, hairpins, clips or nail polish.  Do not wear lotions, powders, or perfumes.   Do not shave body from the neck down 48 hours prior to surgery just in case you cut yourself which could leave a site for infection.  Also, freshly shaved skin may become irritated if using the CHG soap.  Contact lenses, hearing aids and dentures may not be worn into surgery.  Do not bring valuables to the hospital. [facility] is not responsible for any missing/lost belongings or valuables.   Use CHG Soap as directed on instruction sheet.  Notify your doctor if there is any change in your medical condition (cold, fever, infection).  Wear comfortable clothing (specific to your surgery type) to the hospital.  Plan for stool softeners for home use; pain medications have a tendency to cause constipation. You can also help prevent constipation by eating foods high in fiber such as fruits and vegetables and drinking plenty of fluids as your diet allows.  After surgery, you can help prevent lung complications by doing breathing exercises.  Take deep  breaths and cough every 1-2 hours. Your doctor may order a device called an Incentive Spirometer to help you take deep breaths. When coughing or sneezing, hold a pillow firmly against your incision with both hands. This is called "splinting." Doing this helps protect your incision. It also decreases belly discomfort.  If you are being admitted to the  hospital overnight, leave your suitcase in the car. After surgery it may be brought to your room.  If you are being discharged the day of surgery, you will not be allowed to drive home. You will need a responsible adult (18 years or older) to drive you home and stay with you that night.   If you are taking public transportation, you will need to have a responsible adult (18 years or older) with you. Please confirm with your physician that it is acceptable to use public transportation.   Please call the Pre-admissions Testing Dept. at [phone] if you have any questions about these instructions.  Surgery Visitation Policy:  Patients undergoing a surgery or procedure may have one family member or support person with them as long as that person is not COVID-19 positive or experiencing its symptoms.  That person may remain in the waiting area during the procedure.  Inpatient Visitation:    Visiting hours are 7 a.m. to 8 p.m. Inpatients will be allowed two visitors daily. The visitors may change each day during the patient's stay. No visitors under the age of 47. Any visitor under the age of 31 must be accompanied by an adult. The visitor must pass COVID-19 screenings, use hand sanitizer when entering and exiting the patient's room and wear a mask at all times, including in the patient's room. Patients must also wear a mask when staff or their visitor are in the room. Masking is required regardless of vaccination status.

## 2021-01-11 ENCOUNTER — Other Ambulatory Visit: Payer: Self-pay

## 2021-01-11 ENCOUNTER — Encounter
Admission: RE | Admit: 2021-01-11 | Discharge: 2021-01-11 | Disposition: A | Discharge status: Home / Self Care | Payer: BC Managed Care – PPO | Source: Ambulatory Visit | Attending: Obstetrics and Gynecology | Admitting: Obstetrics and Gynecology

## 2021-01-11 ENCOUNTER — Other Ambulatory Visit: Payer: BC Managed Care – PPO

## 2021-01-11 DIAGNOSIS — N921 Excessive and frequent menstruation with irregular cycle: Secondary | ICD-10-CM | POA: Diagnosis not present

## 2021-01-11 DIAGNOSIS — N841 Polyp of cervix uteri: Secondary | ICD-10-CM | POA: Diagnosis not present

## 2021-01-11 DIAGNOSIS — R102 Pelvic and perineal pain: Secondary | ICD-10-CM | POA: Diagnosis not present

## 2021-01-11 DIAGNOSIS — N838 Other noninflammatory disorders of ovary, fallopian tube and broad ligament: Secondary | ICD-10-CM | POA: Diagnosis not present

## 2021-01-11 DIAGNOSIS — N882 Stricture and stenosis of cervix uteri: Secondary | ICD-10-CM | POA: Diagnosis not present

## 2021-01-11 DIAGNOSIS — Z01812 Encounter for preprocedural laboratory examination: Secondary | ICD-10-CM | POA: Insufficient documentation

## 2021-01-11 DIAGNOSIS — N800 Endometriosis of uterus: Secondary | ICD-10-CM | POA: Diagnosis not present

## 2021-01-11 DIAGNOSIS — Z79899 Other long term (current) drug therapy: Secondary | ICD-10-CM | POA: Diagnosis not present

## 2021-01-11 DIAGNOSIS — N888 Other specified noninflammatory disorders of cervix uteri: Secondary | ICD-10-CM | POA: Diagnosis not present

## 2021-01-11 LAB — COMPREHENSIVE METABOLIC PANEL
ALT: 15 U/L (ref 0–44)
AST: 17 U/L (ref 15–41)
Albumin: 3.9 g/dL (ref 3.5–5.0)
Alkaline Phosphatase: 42 U/L (ref 38–126)
Anion gap: 8 (ref 5–15)
BUN: 11 mg/dL (ref 6–20)
CO2: 27 mmol/L (ref 22–32)
Calcium: 9 mg/dL (ref 8.9–10.3)
Chloride: 105 mmol/L (ref 98–111)
Creatinine, Ser: 0.88 mg/dL (ref 0.44–1.00)
GFR, Estimated: >60 mL/min (ref >60)
Glucose, Bld: 92 mg/dL (ref 70–99)
Potassium: 3.6 mmol/L (ref 3.5–5.1)
Sodium: 140 mmol/L (ref 135–145)
Total Bilirubin: 0.6 mg/dL (ref 0.3–1.2)
Total Protein: 6.9 g/dL (ref 6.5–8.1)

## 2021-01-11 LAB — TYPE AND SCREEN
ABO/RH(D): AB POS
Antibody Screen: NEGATIVE

## 2021-01-11 LAB — CBC
HCT: 42.8 % (ref 36.0–46.0)
Hemoglobin: 14.9 g/dL (ref 12.0–15.0)
MCH: 32.3 pg (ref 26.0–34.0)
MCHC: 34.8 g/dL (ref 30.0–36.0)
MCV: 92.6 fL (ref 80.0–100.0)
Platelets: 268 10*3/uL (ref 150–400)
RBC: 4.62 MIL/uL (ref 3.87–5.11)
RDW: 13.1 % (ref 11.5–15.5)
WBC: 5.2 10*3/uL (ref 4.0–10.5)
nRBC: 0 % (ref 0.0–0.2)

## 2021-01-12 MED ORDER — CEFAZOLIN SODIUM-DEXTROSE 2-4 GM/100ML-% IV SOLN
2.0000 g | INTRAVENOUS | Status: AC
  Administered 2021-01-13: 2 g via INTRAVENOUS

## 2021-01-12 MED ORDER — ORAL CARE MOUTH RINSE: 15.0000 mL | Freq: Once | OROMUCOSAL | Status: AC

## 2021-01-12 MED ORDER — POVIDONE-IODINE 10 % EX SWAB
2.0000 "application " | Freq: Once | CUTANEOUS | Status: AC
  Administered 2021-01-13: 2 via TOPICAL

## 2021-01-12 MED ORDER — FAMOTIDINE 20 MG PO TABS: 20.0000 mg | ORAL_TABLET | Freq: Once | ORAL | Status: DC

## 2021-01-12 MED ORDER — LACTATED RINGERS IV SOLN: INTRAVENOUS | Status: DC

## 2021-01-12 MED ORDER — CHLORHEXIDINE GLUCONATE 0.12 % MT SOLN
15.0000 mL | Freq: Once | OROMUCOSAL | Status: AC
  Administered 2021-01-13: 15 mL via OROMUCOSAL

## 2021-01-13 ENCOUNTER — Ambulatory Visit: Payer: BC Managed Care – PPO | Admitting: Certified Registered"

## 2021-01-13 ENCOUNTER — Encounter
Admission: RE | Disposition: A | Discharge status: Home / Self Care | Payer: Self-pay | Source: Home / Self Care | Attending: Obstetrics and Gynecology

## 2021-01-13 ENCOUNTER — Encounter: Payer: Self-pay | Admitting: Obstetrics and Gynecology

## 2021-01-13 ENCOUNTER — Other Ambulatory Visit: Payer: Self-pay

## 2021-01-13 ENCOUNTER — Ambulatory Visit
Admission: RE | Admit: 2021-01-13 | Discharge: 2021-01-13 | Disposition: A | Discharge status: Home / Self Care | Payer: BC Managed Care – PPO | Attending: Obstetrics and Gynecology | Admitting: Obstetrics and Gynecology

## 2021-01-13 DIAGNOSIS — N882 Stricture and stenosis of cervix uteri: Secondary | ICD-10-CM | POA: Insufficient documentation

## 2021-01-13 DIAGNOSIS — Z79899 Other long term (current) drug therapy: Secondary | ICD-10-CM | POA: Insufficient documentation

## 2021-01-13 DIAGNOSIS — N921 Excessive and frequent menstruation with irregular cycle: Secondary | ICD-10-CM | POA: Diagnosis not present

## 2021-01-13 DIAGNOSIS — R102 Pelvic and perineal pain: Secondary | ICD-10-CM | POA: Insufficient documentation

## 2021-01-13 DIAGNOSIS — N888 Other specified noninflammatory disorders of cervix uteri: Secondary | ICD-10-CM | POA: Insufficient documentation

## 2021-01-13 DIAGNOSIS — N800 Endometriosis of uterus: Secondary | ICD-10-CM | POA: Diagnosis not present

## 2021-01-13 DIAGNOSIS — N838 Other noninflammatory disorders of ovary, fallopian tube and broad ligament: Secondary | ICD-10-CM | POA: Diagnosis not present

## 2021-01-13 DIAGNOSIS — N841 Polyp of cervix uteri: Secondary | ICD-10-CM | POA: Insufficient documentation

## 2021-01-13 HISTORY — PX: TOTAL LAPAROSCOPIC HYSTERECTOMY WITH SALPINGECTOMY: SHX6742

## 2021-01-13 HISTORY — PX: CYSTOSCOPY: SHX5120

## 2021-01-13 LAB — POCT PREGNANCY, URINE: Preg Test, Ur: NEGATIVE

## 2021-01-13 LAB — ABO/RH: ABO/RH(D): AB POS

## 2021-01-13 SURGERY — HYSTERECTOMY, TOTAL, LAPAROSCOPIC, WITH SALPINGECTOMY
Anesthesia: General

## 2021-01-13 MED ORDER — DEXAMETHASONE SODIUM PHOSPHATE 10 MG/ML IJ SOLN
INTRAMUSCULAR | Status: DC | PRN
  Administered 2021-01-13: 10 mg via INTRAVENOUS

## 2021-01-13 MED ORDER — ONDANSETRON 4 MG PO TBDP: 4.0000 mg | ORAL_TABLET | Freq: Three times a day (TID) | ORAL | 0 refills | Status: DC | PRN

## 2021-01-13 MED ORDER — LIDOCAINE HCL (PF) 2 % IJ SOLN
INTRAMUSCULAR | Status: AC
  Filled 2021-01-13: qty 5

## 2021-01-13 MED ORDER — OXYCODONE-ACETAMINOPHEN 2.5-325 MG PO TABS: 1.0000 | ORAL_TABLET | Freq: Four times a day (QID) | ORAL | 0 refills | Status: DC | PRN

## 2021-01-13 MED ORDER — BUPIVACAINE LIPOSOME 1.3 % IJ SUSP
INTRAMUSCULAR | Status: AC
  Filled 2021-01-13: qty 20

## 2021-01-13 MED ORDER — ROCURONIUM BROMIDE 10 MG/ML (PF) SYRINGE
PREFILLED_SYRINGE | INTRAVENOUS | Status: AC
  Filled 2021-01-13: qty 10

## 2021-01-13 MED ORDER — LIDOCAINE HCL (CARDIAC) PF 100 MG/5ML IV SOSY
PREFILLED_SYRINGE | INTRAVENOUS | Status: DC | PRN
  Administered 2021-01-13: 80 mg via INTRAVENOUS

## 2021-01-13 MED ORDER — IBUPROFEN 600 MG PO TABS: 600.0000 mg | ORAL_TABLET | Freq: Four times a day (QID) | ORAL | 0 refills | Status: DC

## 2021-01-13 MED ORDER — CEFAZOLIN SODIUM-DEXTROSE 2-4 GM/100ML-% IV SOLN
INTRAVENOUS | Status: AC
  Filled 2021-01-13: qty 100

## 2021-01-13 MED ORDER — PROPOFOL 10 MG/ML IV BOLUS
INTRAVENOUS | Status: AC
  Filled 2021-01-13: qty 40

## 2021-01-13 MED ORDER — FENTANYL CITRATE (PF) 100 MCG/2ML IJ SOLN
INTRAMUSCULAR | Status: DC | PRN
  Administered 2021-01-13 (×2): 50 ug via INTRAVENOUS

## 2021-01-13 MED ORDER — FAMOTIDINE 20 MG PO TABS
ORAL_TABLET | ORAL | Status: AC
  Administered 2021-01-13: 20 mg
  Filled 2021-01-13: qty 1

## 2021-01-13 MED ORDER — ONDANSETRON HCL 4 MG/2ML IJ SOLN: 4.0000 mg | Freq: Once | INTRAMUSCULAR | Status: DC | PRN

## 2021-01-13 MED ORDER — MEPERIDINE HCL 50 MG/ML IJ SOLN: 6.2500 mg | INTRAMUSCULAR | Status: DC | PRN

## 2021-01-13 MED ORDER — KETOROLAC TROMETHAMINE 30 MG/ML IJ SOLN
INTRAMUSCULAR | Status: DC | PRN
  Administered 2021-01-13: 15 mg via INTRAVENOUS

## 2021-01-13 MED ORDER — BUPIVACAINE LIPOSOME 1.3 % IJ SUSP
INTRAMUSCULAR | Status: DC | PRN
  Administered 2021-01-13: 20 mL

## 2021-01-13 MED ORDER — PHENYLEPHRINE HCL (PRESSORS) 10 MG/ML IV SOLN
INTRAVENOUS | Status: DC | PRN
  Administered 2021-01-13 (×4): 100 ug via INTRAVENOUS

## 2021-01-13 MED ORDER — ACETAMINOPHEN 10 MG/ML IV SOLN
INTRAVENOUS | Status: DC | PRN
  Administered 2021-01-13: 1000 mg via INTRAVENOUS

## 2021-01-13 MED ORDER — FENTANYL CITRATE (PF) 100 MCG/2ML IJ SOLN
INTRAMUSCULAR | Status: AC
  Filled 2021-01-13: qty 2

## 2021-01-13 MED ORDER — DEXMEDETOMIDINE (PRECEDEX) IN NS 20 MCG/5ML (4 MCG/ML) IV SYRINGE
PREFILLED_SYRINGE | INTRAVENOUS | Status: AC
  Filled 2021-01-13: qty 5

## 2021-01-13 MED ORDER — SUGAMMADEX SODIUM 200 MG/2ML IV SOLN
INTRAVENOUS | Status: DC | PRN
  Administered 2021-01-13: 200 mg via INTRAVENOUS

## 2021-01-13 MED ORDER — VASOPRESSIN 20 UNIT/ML IV SOLN
INTRAVENOUS | Status: AC
  Filled 2021-01-13: qty 1

## 2021-01-13 MED ORDER — MIDAZOLAM HCL 2 MG/2ML IJ SOLN
INTRAMUSCULAR | Status: AC
  Filled 2021-01-13: qty 2

## 2021-01-13 MED ORDER — CHLORHEXIDINE GLUCONATE 0.12 % MT SOLN
OROMUCOSAL | Status: AC
  Filled 2021-01-13: qty 15

## 2021-01-13 MED ORDER — DEXAMETHASONE SODIUM PHOSPHATE 10 MG/ML IJ SOLN
INTRAMUSCULAR | Status: AC
  Filled 2021-01-13: qty 1

## 2021-01-13 MED ORDER — ACETAMINOPHEN 10 MG/ML IV SOLN
INTRAVENOUS | Status: AC
  Filled 2021-01-13: qty 100

## 2021-01-13 MED ORDER — ROCURONIUM BROMIDE 100 MG/10ML IV SOLN
INTRAVENOUS | Status: DC | PRN
  Administered 2021-01-13: 50 mg via INTRAVENOUS
  Administered 2021-01-13: 20 mg via INTRAVENOUS
  Administered 2021-01-13: 30 mg via INTRAVENOUS

## 2021-01-13 MED ORDER — ONDANSETRON HCL 4 MG/2ML IJ SOLN
INTRAMUSCULAR | Status: AC
  Filled 2021-01-13: qty 2

## 2021-01-13 MED ORDER — PHENYLEPHRINE HCL (PRESSORS) 10 MG/ML IV SOLN
INTRAVENOUS | Status: AC
  Filled 2021-01-13: qty 1

## 2021-01-13 MED ORDER — FENTANYL CITRATE (PF) 100 MCG/2ML IJ SOLN
25.0000 ug | INTRAMUSCULAR | Status: DC | PRN
  Administered 2021-01-13: 25 ug via INTRAVENOUS
  Administered 2021-01-13: 50 ug via INTRAVENOUS
  Administered 2021-01-13: 25 ug via INTRAVENOUS
  Administered 2021-01-13: 50 ug via INTRAVENOUS

## 2021-01-13 MED ORDER — OXYCODONE-ACETAMINOPHEN 5-325 MG PO TABS: 1.0000 | ORAL_TABLET | ORAL | 0 refills | Status: AC | PRN

## 2021-01-13 MED ORDER — KETOROLAC TROMETHAMINE 30 MG/ML IJ SOLN
INTRAMUSCULAR | Status: AC
  Filled 2021-01-13: qty 1

## 2021-01-13 MED ORDER — PROPOFOL 10 MG/ML IV BOLUS
INTRAVENOUS | Status: DC | PRN
  Administered 2021-01-13: 150 mg via INTRAVENOUS

## 2021-01-13 MED ORDER — OXYCODONE-ACETAMINOPHEN 5-325 MG PO TABS: 1.0000 | ORAL_TABLET | ORAL | Status: DC | PRN

## 2021-01-13 MED ORDER — OXYCODONE-ACETAMINOPHEN 5-325 MG PO TABS
ORAL_TABLET | ORAL | Status: AC
  Administered 2021-01-13: 1 via ORAL
  Filled 2021-01-13: qty 1

## 2021-01-13 MED ORDER — ONDANSETRON HCL 4 MG/2ML IJ SOLN
INTRAMUSCULAR | Status: DC | PRN
  Administered 2021-01-13: 4 mg via INTRAVENOUS

## 2021-01-13 MED ORDER — MIDAZOLAM HCL 2 MG/2ML IJ SOLN
INTRAMUSCULAR | Status: DC | PRN
  Administered 2021-01-13: 2 mg via INTRAVENOUS

## 2021-01-13 SURGICAL SUPPLY — 61 items
ADH SKN CLS APL DERMABOND .7 (GAUZE/BANDAGES/DRESSINGS) ×2
APL PRP STRL LF DISP 70% ISPRP (MISCELLANEOUS) ×2
APL SRG 38 LTWT LNG FL B (MISCELLANEOUS) ×2
APPLICATOR ARISTA FLEXITIP XL (MISCELLANEOUS) ×1 IMPLANT
BAG DRN RND TRDRP ANRFLXCHMBR (UROLOGICAL SUPPLIES) ×2
BAG URINE DRAIN 2000ML AR STRL (UROLOGICAL SUPPLIES) ×3 IMPLANT
BLADE SURG SZ11 CARB STEEL (BLADE) ×3 IMPLANT
CATH FOLEY 2WAY  5CC 16FR (CATHETERS) ×3
CATH FOLEY 2WAY 5CC 16FR (CATHETERS) ×2
CATH ROBINSON RED A/P 14FR (CATHETERS) ×2 IMPLANT
CATH URTH 16FR FL 2W BLN LF (CATHETERS) ×2 IMPLANT
CHLORAPREP W/TINT 26 (MISCELLANEOUS) ×3 IMPLANT
COVER WAND RF STERILE (DRAPES) ×3 IMPLANT
DEFOGGER SCOPE WARMER CLEARIFY (MISCELLANEOUS) ×3 IMPLANT
DERMABOND ADVANCED (GAUZE/BANDAGES/DRESSINGS) ×1
DERMABOND ADVANCED .7 DNX12 (GAUZE/BANDAGES/DRESSINGS) ×2 IMPLANT
DEVICE SUTURE ENDOST 10MM (ENDOMECHANICALS) ×2 IMPLANT
DRAPE 3/4 80X56 (DRAPES) ×3 IMPLANT
DRAPE LEGGINS SURG 28X43 STRL (DRAPES) ×3 IMPLANT
DRAPE UNDER BUTTOCK W/FLU (DRAPES) ×3 IMPLANT
GAUZE 4X4 16PLY RFD (DISPOSABLE) ×3 IMPLANT
GLOVE SURG ENC MOIS LTX SZ7 (GLOVE) ×12 IMPLANT
GLOVE SURG UNDER LTX SZ7.5 (GLOVE) ×26 IMPLANT
GLOVE SURG UNDER POLY LF SZ7.5 (GLOVE) ×25 IMPLANT
GOWN STRL REUS W/ TWL LRG LVL3 (GOWN DISPOSABLE) ×12 IMPLANT
GOWN STRL REUS W/ TWL XL LVL3 (GOWN DISPOSABLE) ×2 IMPLANT
GOWN STRL REUS W/TWL LRG LVL3 (GOWN DISPOSABLE) ×18
GOWN STRL REUS W/TWL XL LVL3 (GOWN DISPOSABLE)
GRASPER SUT TROCAR 14GX15 (MISCELLANEOUS) ×3 IMPLANT
HEMOSTAT ARISTA ABSORB 3G PWDR (HEMOSTASIS) ×1 IMPLANT
IRRIGATION STRYKERFLOW (MISCELLANEOUS) IMPLANT
IRRIGATOR STRYKERFLOW (MISCELLANEOUS) ×3
IV LACTATED RINGERS 1000ML (IV SOLUTION) ×6 IMPLANT
KIT PINK PAD W/HEAD ARE REST (MISCELLANEOUS) ×3
KIT PINK PAD W/HEAD ARM REST (MISCELLANEOUS) ×2 IMPLANT
KIT TURNOVER CYSTO (KITS) ×3 IMPLANT
LABEL OR SOLS (LABEL) ×3 IMPLANT
LIGASURE VESSEL 5MM BLUNT TIP (ELECTROSURGICAL) ×2 IMPLANT
MANIFOLD NEPTUNE II (INSTRUMENTS) ×3 IMPLANT
MANIPULATOR VCARE LG CRV RETR (MISCELLANEOUS) ×1 IMPLANT
NEEDLE HYPO 22GX1.5 SAFETY (NEEDLE) ×3 IMPLANT
OCCLUDER COLPOPNEUMO (BALLOONS) ×3 IMPLANT
PACK LAP CHOLECYSTECTOMY (MISCELLANEOUS) ×3 IMPLANT
PAD OB MATERNITY 4.3X12.25 (PERSONAL CARE ITEMS) ×3 IMPLANT
PAD PREP 24X41 OB/GYN DISP (PERSONAL CARE ITEMS) ×3 IMPLANT
SCISSORS METZENBAUM CVD 33 (INSTRUMENTS) ×3 IMPLANT
SET CYSTO W/LG BORE CLAMP LF (SET/KITS/TRAYS/PACK) ×3 IMPLANT
SET TRI-LUMEN FLTR TB AIRSEAL (TUBING) ×3 IMPLANT
SLEEVE ENDOPATH XCEL 5M (ENDOMECHANICALS) ×6 IMPLANT
SOL PREP PVP 2OZ (MISCELLANEOUS) ×3
SOLUTION PREP PVP 2OZ (MISCELLANEOUS) ×2 IMPLANT
SURGILUBE 2OZ TUBE FLIPTOP (MISCELLANEOUS) ×3 IMPLANT
SUT ENDO VLOC 180-0-8IN (SUTURE) ×3 IMPLANT
SUT MNCRL 4-0 (SUTURE) ×3
SUT MNCRL 4-0 27XMFL (SUTURE) ×2
SUT VIC AB 0 CT1 36 (SUTURE) ×3 IMPLANT
SUTURE MNCRL 4-0 27XMF (SUTURE) ×2 IMPLANT
SYR 10ML LL (SYRINGE) ×6 IMPLANT
SYR 50ML LL SCALE MARK (SYRINGE) ×3 IMPLANT
TROCAR PORT AIRSEAL 8X100 (TROCAR) ×3 IMPLANT
TROCAR XCEL NON-BLD 5MMX100MML (ENDOMECHANICALS) ×3 IMPLANT

## 2021-01-13 NOTE — Discharge Instructions (Signed)
AMBULATORY SURGERY  DISCHARGE INSTRUCTIONS   1) The drugs that you were given will stay in your system until tomorrow so for the next 24 hours you should not:  A) Drive an automobile B) Make any legal decisions C) Drink any alcoholic beverage   2) You may resume regular meals tomorrow.  Today it is better to start with liquids and gradually work up to solid foods.  You may eat anything you prefer, but it is better to start with liquids, then soup and crackers, and gradually work up to solid foods.   3) Please notify your doctor immediately if you have any unusual bleeding, trouble breathing, redness and pain at the surgery site, drainage, fever, or pain not relieved by medication.  4) Additional Instructions: [phone] Hospital main number  Leave green arm band on for 4 days

## 2021-01-13 NOTE — Anesthesia Postprocedure Evaluation (Signed)
Anesthesia Post Note  Patient: Tanya Cochran  Procedure(s) Performed: TOTAL LAPAROSCOPIC HYSTERECTOMY WITH SALPINGECTOMY (Bilateral ) CYSTOSCOPY (N/A )  Patient location during evaluation: PACU Anesthesia Type: General Level of consciousness: awake and alert, awake and oriented Pain management: pain level controlled Vital Signs Assessment: post-procedure vital signs reviewed and stable Respiratory status: spontaneous breathing, nonlabored ventilation and respiratory function stable Cardiovascular status: blood pressure returned to baseline and stable Postop Assessment: no apparent nausea or vomiting Anesthetic complications: no   No complications documented.   Last Vitals:  Vitals:   01/13/21 1020 01/13/21 1034  BP:    Pulse: 75 76  Resp: (!) 23 18  Temp:    SpO2: 100% 100%    Last Pain:  Vitals:   01/13/21 1034  TempSrc:   PainSc: 7                  Manfred Arch

## 2021-01-13 NOTE — Interval H&P Note (Signed)
History and Physical Interval Note:  01/13/2021 7:13 AM  Tanya Cochran  has presented today for surgery, with the diagnosis of Pelvic Pain R10.2 Menorrhagia with irregular cycle N92.1 Cervical stenosis N88.2 Cervical polyp N84.1.  The various methods of treatment have been discussed with the patient and family. After consideration of risks, benefits and other options for treatment, the patient has consented to  Procedure(s): TOTAL LAPAROSCOPIC HYSTERECTOMY WITH SALPINGECTOMY (Bilateral) CYSTOSCOPY (N/A) as a surgical intervention.  The patient's history has been reviewed, patient examined, no change in status, stable for surgery.  I have reviewed the patient's chart and labs.  Questions were answered to the patient's satisfaction.  Consents reviewed and patient agrees to proceed.    Thomasene Mohair, MD, [facility], [facility] 01/13/2021 7:13 AM

## 2021-01-13 NOTE — Transfer of Care (Signed)
Immediate Anesthesia Transfer of Care Note  Patient: Tanya Cochran  Procedure(s) Performed: TOTAL LAPAROSCOPIC HYSTERECTOMY WITH SALPINGECTOMY (Bilateral ) CYSTOSCOPY (N/A )  Patient Location: PACU  Anesthesia Type:General  Level of Consciousness: awake, alert  and oriented  Airway & Oxygen Therapy: Patient Spontanous Breathing and Patient connected to face mask oxygen  Post-op Assessment: Report given to RN and Post -op Vital signs reviewed and stable  Post vital signs: Reviewed and stable  Last Vitals:  Vitals Value Taken Time  BP 89/60 01/13/21 0941  Temp    Pulse 79 01/13/21 0944  Resp 24 01/13/21 0944  SpO2 100 % 01/13/21 0944  Vitals shown include unvalidated device data.  Last Pain:  Vitals:   01/13/21 0622  TempSrc: Oral  PainSc: 0-No pain         Complications: No complications documented.

## 2021-01-13 NOTE — Anesthesia Procedure Notes (Signed)
Procedure Name: Intubation Date/Time: 01/13/2021 7:45 AM Performed by: Gilford Raid, CRNA Pre-anesthesia Checklist: Patient identified, Patient being monitored, Timeout performed, Emergency Drugs available and Suction available Patient Re-evaluated:Patient Re-evaluated prior to induction Oxygen Delivery Method: Circle system utilized Preoxygenation: Pre-oxygenation with 100% oxygen Induction Type: IV induction Ventilation: Mask ventilation without difficulty Laryngoscope Size: Miller and 2 Grade View: Grade I Tube type: Oral Tube size: 7.0 mm Number of attempts: 1 Placement Confirmation: ETT inserted through vocal cords under direct vision,  positive ETCO2 and breath sounds checked- equal and bilateral Secured at: 21 cm Tube secured with: Tape Dental Injury: Teeth and Oropharynx as per pre-operative assessment

## 2021-01-13 NOTE — Anesthesia Preprocedure Evaluation (Signed)
Anesthesia Evaluation  Patient identified by MRN, date of birth, ID band Patient awake    Reviewed: Allergy & Precautions, NPO status , Patient's Chart, lab work & pertinent test results  Airway Mallampati: II  TM Distance: >3 FB Neck ROM: Full    Dental no notable dental hx.    Pulmonary neg pulmonary ROS,    Pulmonary exam normal        Cardiovascular negative cardio ROS Normal cardiovascular exam     Neuro/Psych PSYCHIATRIC DISORDERS Anxiety Depression Bipolar Disorder  Neuromuscular disease    GI/Hepatic negative GI ROS, Neg liver ROS,   Endo/Other  negative endocrine ROS  Renal/GU negative Renal ROS  negative genitourinary   Musculoskeletal  (+) Fibromyalgia -  Abdominal   Peds negative pediatric ROS (+)  Hematology negative hematology ROS (+)   Anesthesia Other Findings   Reproductive/Obstetrics negative OB ROS                             Anesthesia Physical Anesthesia Plan  ASA: II  Anesthesia Plan: General   Post-op Pain Management:    Induction: Intravenous  PONV Risk Score and Plan: 3 and Propofol infusion, Ondansetron and Midazolam  Airway Management Planned: Oral ETT  Additional Equipment:   Intra-op Plan:   Post-operative Plan: Extubation in OR  Informed Consent: I have reviewed the patients History and Physical, chart, labs and discussed the procedure including the risks, benefits and alternatives for the proposed anesthesia with the patient or authorized representative who has indicated his/her understanding and acceptance.     Dental advisory given  Plan Discussed with: CRNA, Anesthesiologist and Surgeon  Anesthesia Plan Comments:         Anesthesia Quick Evaluation

## 2021-01-13 NOTE — Op Note (Signed)
Operative Note    Name: Tanya Cochran  Date of Service: 01/13/2021  DOB: 03/01/1971  MRN: [id number]   Pre-Operative Diagnosis:  1. Menorrhagia with irregular cycle [N92.1] 2. Cervical polyp [N84.1] 3. Cervical stenosis (uterine cervix) [N88.2] 4. Pelvic pain [R10.2]  Post-Operative Diagnosis:  1. Menorrhagia with irregular cycle [N92.1] 2. Cervical polyp [N84.1] 3. Cervical stenosis (uterine cervix) [N88.2] 4. Pelvic pain [R10.2]  Procedures:  1. Total Laparoscopic Hysterectomy, bilateral salpingectomy  2. Cystoscopy  Primary Surgeon: Thomasene Mohair, MD   Assistant Surgeon: Annamarie Major, MD - No other capable assistant available, in surgery requiring high level assistant.  EBL: 25 mL   IVF: 600 mL   Urine output: 600 mL clear urine at end of case  Specimens:  Uterus with cervix and bilateral fallopian tubes  Drains: none  Complications: None   Disposition: PACU   Condition: Stable   Findings:  1.  Normal-appearing uterus and bilateral fallopian tubes 2.  Bilateral simple appearing ovarian cysts, right ovarian hemorrhagic cyst. 3.  Adhesions of bladder to lower uterine segment. 4.  Cervix did not appear stenotic on today's exam as compared to prior.  Procedure Summary:  The patient was taken to the operating room where general anesthesia was administered and found to be adequate. She was placed in the dorsal supine lithotomy position in [place] stirrups and prepped and draped in usual sterile fashion. After a timeout was called, an indwelling catheter was placed in her bladder. A sterile speculum was placed in the vagina and a single-tooth tenaculum was used to grasp the anterior lip of the cervix. A V-Care uterine manipulator was affixed to the uterus in accordance to the manufacturers recommendations. The speculum and tenaculum was removed from the vagina.  Attention was turned to the abdomen where, after injection of local anesthetic, a 5 mm infraumbilical  incision was made with the scalpel. Entry into the abdomen was obtained via Optiview trocar technique (a blunt entry technique with camera visualization through the obturator upon entry). Verification of entry into the abdomen was obtained using opening pressures. The abdomen was insufflated with CO2. The camera was introduced through the trocar with verification of atraumatic entry. Left and right lower quadrant 5 mm ports were created via direct intra-abdominal camera visualization without difficulty. An 8 mm AirSeal suprapubic port was placed in a similar fashion without difficulty.  After inspection of the abdomen and pelvis with the above-noted findings, the bilateral ureters were identified and found to be well away from the operative area of interest. The left fallopian tube was grasped at the fimbriated end and was transected using the LigaSure along the mesosalpinx in a lateral to medial fashion. The LigaSure then was used to transect the left round ligament and the utero-ovarian ligament was transected. Tissue was divided along the left broad ligament to the level of the interior cervical os. Adhesions from the anterior uterus were taken down with great care and the lower uterine segment was identified. Scar tissue and bladder tissue were dissected off the lower uterine segment and cervix without difficulty. The left uterine artery was skeletonized and identified and after ligation was transected with the LigaSure device. The same procedure was carried out on the right side. The colpotomy was performed using monopolar electrocautery in a circumferential fashion following the KOH ring.  The uterus and fallopian tubes and cervix were removed through the vagina.  Closure of the vaginal cuff was undertaken using the V-lock stitch in a running fashion. All vascular pedicles were  inspected and found to be hemostatic. Copious irrigation was undertaken and hemostasis was again verified. Arista 1 gram was  placed along the vaginal cuff closure to ensure ongoing hemostasis.  The cysts were ruptured on both ovaries with spillage of clear liquid on two of the cysts and a blood clot from the right ovarian cyst. Hemostasis was verified.  The suprapubic trocar was removed and the fascia was reapproximated using #0 Vicryl with a single stitch. The abdomen was then desufflated of CO2 after removal of all instruments. The suprapubic skin incision was closed using 4-0 Monocryl in a subcuticular fashion. The remaining skin incisions were closed using surgical skin glue and a layer of surgical skin glue was placed over suprapubic skin incision, as well.   Cystoscopy was undertaken at this point. The Foley catheter was removed and the 70 cystoscope was gently introduced through the urethra. The bladder survey was undertaken with efflux of urine from both ureteral orifices noted. There were no defects noted in the bladder wall. The cystoscope was removed after drainage of the bladder. The catheter was not replaced.   The surgical assistant performed creation of a trocar site, retracted tissue, performed ligation and transection of tissue along the right adnexa and performed part of the colpotomy.  The assistant also assisted with closure of the vaginal cuff with retraction of the suture.  He also assisted in the closure of the suprapubic port.    The patient tolerated the procedure well.  Sponge, lap, needle, and instrument counts were correct x 2.  VTE prophylaxis: SCDs. Antibiotic prophylaxis: Ancef 2 grams IV prior to start of the case (within 30 minutes). She was awakened in the operating room and was taken to the PACU in stable condition. The assistant surgeon was an MD due to lack of availability of another Sales promotion account executive.   Thomasene Mohair, MD 01/13/2021 9:33 AM

## 2021-01-17 LAB — SURGICAL PATHOLOGY

## 2021-01-19 ENCOUNTER — Encounter: Payer: Self-pay | Admitting: Obstetrics and Gynecology

## 2021-01-19 ENCOUNTER — Ambulatory Visit: Payer: BC Managed Care – PPO | Admitting: Obstetrics and Gynecology

## 2021-01-19 ENCOUNTER — Other Ambulatory Visit: Payer: Self-pay

## 2021-01-19 VITALS — BP 100/47 | Ht 63.0 in | Wt 126.0 lb

## 2021-01-19 DIAGNOSIS — Z09 Encounter for follow-up examination after completed treatment for conditions other than malignant neoplasm: Secondary | ICD-10-CM

## 2021-01-19 NOTE — Progress Notes (Signed)
   Postoperative Follow-up Patient presents post op from Total Laparoscopic Hysterectomy, bilateral salpingectomy, cystoscopy  6 days ago for menorrhagia with irregular cycle, cervical polyp, pelvic pain.  Subjective: Patient reports marked improvement in her preop symptoms. Eating a regular diet without difficulty. Patient is taking ibuprofen for pain. Has occasional abdominal muscle spasms and some back spasms.  Activity: increasing slowly.  She denies fever, chills, nausea and vomiting.   Pathology report:  DIAGNOSIS:  A. UTERUS WITH CERVIX AND BILATERAL FALLOPIAN TUBES; TOTAL HYSTERECTOMY  WITH BILATERAL SALPINGECTOMY:  - CERVIX WITH NABOTHIAN CYSTS AND 2.1 CM ENDOCERVICAL POLYP.  - PROLIFERATIVE ENDOMETRIUM.  - MYOMETRIUM WITH FOCAL ADENOMYOSIS.  - RIGHT FALLOPIAN TUBE WITH FIMBRIA AND BENIGN PARATUBAL CYST.  - LEFT FALLOPIAN TUBE WITH FIMBRIA.  - FOCAL BENIGN SEROSAL FIBROSIS, - NON-SPECIFIC.  - NEGATIVE FOR ATYPIA AND MALIGNANCY.   Objective: Vital Signs: BP (!) 100/47   Ht 5\' 3"  (1.6 m)   Wt 126 lb (57.2 kg)   BMI 22.32 kg/m  Physical Exam Constitutional:      General: She is not in acute distress.    Appearance: Normal appearance.  HENT:     Head: Normocephalic and atraumatic.  Eyes:     General: No scleral icterus.    Conjunctiva/sclera: Conjunctivae normal.  Abdominal:     General: There is no distension.     Palpations: Abdomen is soft.     Tenderness: There is no abdominal tenderness. There is no guarding or rebound.     Comments: All incisions clean, dry, and intact without erythema, induration, warmth, and tenderness.   Neurological:     General: No focal deficit present.     Mental Status: She is alert and oriented to person, place, and time.     Cranial Nerves: No cranial nerve deficit.  Psychiatric:        Mood and Affect: Mood normal.        Behavior: Behavior normal.        Judgment: Judgment normal.      Assessment: 50 y.o. s/p above surgery  progressing well  Plan: Patient has done well after surgery with no apparent complications.  I have discussed the post-operative course to date, and the expected progress moving forward.  The patient understands what complications to be concerned about.  I will see the patient in routine follow up, or sooner if needed.    Activity plan: increase slowly No sex for 8-12 weeks postop  Return in about 5 weeks (around 02/23/2021) for Six week post op visit.   02/25/2021, MD  01/19/2021, 12:05 PM

## 2021-01-26 ENCOUNTER — Telehealth: Payer: Self-pay

## 2021-01-26 NOTE — Telephone Encounter (Signed)
Pt calling; had total hyst 2wks ago; has started bleeding; really needs to talk to [facility].  [phone] Courtesy call to pt to let her know, which she already knew, SDJ not in office until tomorrow.  States she hasn't felt good the last few days; is really tired; feels more pressure down there than she has; no d/c until this am when she wiped; it was more than spotting and pinkish brown.  Offered to send msg to another surgeon in the office today, send msg to [facility] who assisted tomorrow or SDJ for Friday.  Pt opts to monitor for a few hours and if worsens will call me back.

## 2021-01-28 ENCOUNTER — Encounter: Payer: Self-pay | Admitting: Obstetrics and Gynecology

## 2021-01-28 ENCOUNTER — Ambulatory Visit: Payer: BC Managed Care – PPO | Admitting: Obstetrics and Gynecology

## 2021-01-28 ENCOUNTER — Other Ambulatory Visit: Payer: Self-pay

## 2021-01-28 VITALS — BP 100/64 | Ht 63.0 in | Wt 127.0 lb

## 2021-01-28 DIAGNOSIS — N939 Abnormal uterine and vaginal bleeding, unspecified: Secondary | ICD-10-CM

## 2021-01-28 DIAGNOSIS — Z90710 Acquired absence of both cervix and uterus: Secondary | ICD-10-CM

## 2021-01-28 NOTE — Telephone Encounter (Signed)
Pt scheduled to see SDJ today.

## 2021-01-28 NOTE — Progress Notes (Signed)
Postoperative Follow-up Patient presents post op from Total Laparoscopic Hysterectomy, bilateral salpingectomy, cystoscopy  15 days ago for menorrhagia with irregular cycle, cervical polyp, pelvic pain.  Subjective: Patient reports marked improvement in her preop symptoms. Eating a regular diet without difficulty. Patient is taking ibuprofen for pain. Has occasional abdominal muscle spasms and some back spasms.  Activity: increasing slowly.  She denies fever, chills, nausea and vomiting.  She noted some dark blood-like material the last couple of days. She only notes it when she wipes. She has no pain, no fever, no abnormal discharge otherwise.  She is concerned something is wrong since she has been overdoing it at home.  Pathology report:  DIAGNOSIS:  A. UTERUS WITH CERVIX AND BILATERAL FALLOPIAN TUBES; TOTAL HYSTERECTOMY  WITH BILATERAL SALPINGECTOMY:  - CERVIX WITH NABOTHIAN CYSTS AND 2.1 CM ENDOCERVICAL POLYP.  - PROLIFERATIVE ENDOMETRIUM.  - MYOMETRIUM WITH FOCAL ADENOMYOSIS.  - RIGHT FALLOPIAN TUBE WITH FIMBRIA AND BENIGN PARATUBAL CYST.  - LEFT FALLOPIAN TUBE WITH FIMBRIA.  - FOCAL BENIGN SEROSAL FIBROSIS, - NON-SPECIFIC.  - NEGATIVE FOR ATYPIA AND MALIGNANCY.   Objective: Vital Signs: BP 100/64   Ht 5\' 3"  (1.6 m)   Wt 127 lb (57.6 kg)   BMI 22.50 kg/m  Physical Exam Constitutional:      General: She is not in acute distress.    Appearance: Normal appearance.  Genitourinary:     Right Labia: No rash, tenderness or lesions.    Left Labia: No tenderness, lesions or rash.    Pelvic Tanner Score: 5/5.    Vaginal cuff intact.    No vaginal tenderness, bleeding, ulceration or cuff induration.     No vaginal prolapse present.    No vaginal atrophy present.    Vaginal exam comments: Cuff is intact. There is a small (~1 cm) very dark clot within the vaginal  Vault. The suture line is well visualized and is intact with no active bleeding. On palpation there is no induration,  warmth, and tenderness.  .      Right Adnexa: not tender, not full and no mass present.    Left Adnexa: not tender, not full and no mass present.    Cervix is absent.     Uterus is absent.  HENT:     Head: Normocephalic and atraumatic.  Eyes:     General: No scleral icterus.    Conjunctiva/sclera: Conjunctivae normal.  Abdominal:     General: There is no distension.     Palpations: Abdomen is soft.     Tenderness: There is no abdominal tenderness. There is no guarding or rebound.     Comments: All incisions clean, dry, and intact without erythema, induration, warmth, and tenderness.   Neurological:     General: No focal deficit present.     Mental Status: She is alert and oriented to person, place, and time.     Cranial Nerves: No cranial nerve deficit.  Psychiatric:        Mood and Affect: Mood normal.        Behavior: Behavior normal.        Judgment: Judgment normal.      Assessment: 50 y.o. s/p above surgery progressing well, small vaginal bleeding.  Plan: Reassurance was provided to the patient.  There is no evidence of infection or cuff dehiscence at this time.  I encouraged her to contact my office immediately if further bleeding occurred or if any other concerning symptoms occurred.  Activity plan: increase  slowly No sex for 8-12 weeks postop  Return in about 11 days (around 02/08/2021) for Follow up Dr. Jean Rosenthal (OK to over/double book).   Thomasene Mohair, MD  01/28/2021, 6:12 PM

## 2021-02-07 ENCOUNTER — Other Ambulatory Visit: Payer: Self-pay

## 2021-02-07 ENCOUNTER — Encounter: Payer: Self-pay | Admitting: Obstetrics and Gynecology

## 2021-02-07 ENCOUNTER — Ambulatory Visit: Payer: BC Managed Care – PPO | Admitting: Obstetrics and Gynecology

## 2021-02-07 VITALS — BP 90/60 | Ht 63.0 in | Wt 128.0 lb

## 2021-02-07 DIAGNOSIS — Z09 Encounter for follow-up examination after completed treatment for conditions other than malignant neoplasm: Secondary | ICD-10-CM

## 2021-02-07 MED ORDER — CEPHALEXIN 500 MG PO TABS: 500.0000 mg | ORAL_TABLET | Freq: Four times a day (QID) | ORAL | 0 refills | Status: AC

## 2021-02-07 NOTE — Progress Notes (Signed)
Postoperative Follow-up Patient presents post op from Total Laparoscopic Hysterectomy, bilateral salpingectomy, cystoscopy  3 weeks ago for menorrhagia with irregular cycle, cervical polyp, pelvic pain.  Subjective: Patient reports marked improvement in her preop symptoms. Eating a regular diet without difficulty. Activity: increasing slowly.  She denies fever, chills, nausea and vomiting.  She had a better week since her last visit. However, she did not a small amount of brown blood. This morning she had a lighter color of discharge today. These symptoms were more consistent with the symptoms from her last visit.  She has no pain, no fever, no abnormal discharge otherwise.    Pathology report:  DIAGNOSIS:  A. UTERUS WITH CERVIX AND BILATERAL FALLOPIAN TUBES; TOTAL HYSTERECTOMY  WITH BILATERAL SALPINGECTOMY:  - CERVIX WITH NABOTHIAN CYSTS AND 2.1 CM ENDOCERVICAL POLYP.  - PROLIFERATIVE ENDOMETRIUM.  - MYOMETRIUM WITH FOCAL ADENOMYOSIS.  - RIGHT FALLOPIAN TUBE WITH FIMBRIA AND BENIGN PARATUBAL CYST.  - LEFT FALLOPIAN TUBE WITH FIMBRIA.  - FOCAL BENIGN SEROSAL FIBROSIS, - NON-SPECIFIC.  - NEGATIVE FOR ATYPIA AND MALIGNANCY.   Objective: Vital Signs: BP 90/60   Ht 5\' 3"  (1.6 m)   Wt 128 lb (58.1 kg)   BMI 22.67 kg/m  Physical Exam Constitutional:      General: She is not in acute distress.    Appearance: Normal appearance.  Genitourinary:     Right Labia: No rash, tenderness or lesions.    Left Labia: No tenderness, lesions or rash.    Pelvic Tanner Score: 5/5.    Vaginal cuff intact.    Perineal sutures intact.    Vaginal bleeding present.     No vaginal tenderness, ulceration or cuff induration.     No vaginal prolapse present.    No vaginal atrophy present.    Vaginal exam comments: Cuff is intact. There is a small amount of dark-red blood in vaginal vault. On palpation there is no induration, warmth.  There is a greater-than expected amount of tenderness on the right  side.  .      Right Adnexa: not tender, not full and no mass present.    Left Adnexa: not tender, not full and no mass present.    Cervix is absent.     Uterus is absent.  HENT:     Head: Normocephalic and atraumatic.  Eyes:     General: No scleral icterus.    Conjunctiva/sclera: Conjunctivae normal.  Abdominal:     General: There is no distension.     Palpations: Abdomen is soft.     Tenderness: There is no abdominal tenderness. There is no guarding or rebound.     Comments: All incisions clean, dry, and intact without erythema, induration, warmth, and tenderness.   Neurological:     General: No focal deficit present.     Mental Status: She is alert and oriented to person, place, and time.     Cranial Nerves: No cranial nerve deficit.  Psychiatric:        Mood and Affect: Mood normal.        Behavior: Behavior normal.        Judgment: Judgment normal.      Assessment: 50 y.o. s/p above surgery progressing well, small vaginal bleeding.  Plan: Reassurance was provided to the patient.  There is no evidence of infection or cuff dehiscence at this time.  I encouraged her to contact my office immediately if further bleeding occurred or if any other concerning symptoms occurred.  Will  treat as if there is an infection, especially in the absence of other concerning symptoms or findings.   Activity plan: increase slowly No sex for 8-12 weeks postop  Return in about 1 week (around 02/14/2021) for Post op incision check (may over/double book).   Thomasene Mohair, MD  02/07/2021, 10:38 AM

## 2021-02-10 ENCOUNTER — Telehealth: Payer: Self-pay

## 2021-02-10 ENCOUNTER — Other Ambulatory Visit: Payer: Self-pay

## 2021-02-10 ENCOUNTER — Telehealth: Payer: Self-pay | Admitting: Obstetrics and Gynecology

## 2021-02-10 ENCOUNTER — Other Ambulatory Visit: Payer: Self-pay | Admitting: Obstetrics and Gynecology

## 2021-02-10 ENCOUNTER — Ambulatory Visit
Admission: RE | Admit: 2021-02-10 | Discharge: 2021-02-10 | Disposition: A | Discharge status: Home / Self Care | Payer: BC Managed Care – PPO | Source: Ambulatory Visit | Attending: Obstetrics and Gynecology | Admitting: Obstetrics and Gynecology

## 2021-02-10 DIAGNOSIS — Z90710 Acquired absence of both cervix and uterus: Secondary | ICD-10-CM | POA: Diagnosis not present

## 2021-02-10 DIAGNOSIS — N939 Abnormal uterine and vaginal bleeding, unspecified: Secondary | ICD-10-CM | POA: Diagnosis not present

## 2021-02-10 DIAGNOSIS — N99820 Postprocedural hemorrhage and hematoma of a genitourinary system organ or structure following a genitourinary system procedure: Secondary | ICD-10-CM | POA: Diagnosis not present

## 2021-02-10 NOTE — Addendum Note (Signed)
Addended by: Thomasene Mohair D on: 02/10/2021 01:54 PM   Modules accepted: Orders

## 2021-02-10 NOTE — Telephone Encounter (Signed)
Yesterday she bled all day. The blood was bright red. She had to wear a pad.  This morning the bleeding is lighter.  She denies fevers, chills. She has had some pain.  She had pelvic pain.  She feels better this morning.   Plan is to get an ultrasound ASAP and do an exam under anesthesia next Tuesday. Precautiions for heavier bleeding any new symptoms (fevers, chills, etc) given. Continue taking abx.

## 2021-02-10 NOTE — Telephone Encounter (Signed)
-----   Message from Conard Novak, MD sent at 02/10/2021  1:19 PM EDT ----- Regarding: Schedule surgery! Surgery Booking Request Patient Full Name:  MISTI TOWLE  MRN: [id number]  DOB: Jan 21, 1971  Surgeon: Thomasene Mohair, MD  Requested Surgery Date and Time: 02/15/2021  Primary Diagnosis AND Code: postoperative vaginal bleeding following genitourinary procedure [N99.820] Secondary Diagnosis and Code:  Surgical Procedure: Pelvic exam under anesthesia RNFA Requested?: No L&D Notification: No Admission Status: same day surgery Length of Surgery: 25 min Special Case Needs: No H&P: No Phone Interview???:  Yes Interpreter: No Medical Clearance:  No Special Scheduling Instructions: Will do H&P and consents day of surgery Any known health/anesthesia issues, diabetes, sleep apnea, latex allergy, defibrillator/pacemaker?: No Acuity: P1   (P1 highest, P2 delay may cause harm, P3 low, elective gyn, P4 lowest)

## 2021-02-10 NOTE — Telephone Encounter (Signed)
Discussed findings on ultrasound (no obvious etiology). Will plan with exam under anesthesia.

## 2021-02-10 NOTE — Telephone Encounter (Signed)
Called patient to schedule pelvic exam under anesthesia w Britt Boozer 6/14  H&P  DOS  Pre-admit phone call appointment to be requested - date and time will be included on H&P paper work. Also all appointments will be updated on pt MyChart. Explained that this appointment has a call window. Based on the time scheduled will indicate if the call will be received within a 4 hour window before 1:00 or after.  Advised that pt may also receive calls from the hospital pharmacy and pre-service center.  Confirmed pt has [employer] as Editor, commissioning. No secondary insurance.

## 2021-02-14 ENCOUNTER — Telehealth: Payer: Self-pay | Admitting: Obstetrics and Gynecology

## 2021-02-14 ENCOUNTER — Encounter
Admission: RE | Admit: 2021-02-14 | Discharge: 2021-02-14 | Disposition: A | Discharge status: Home / Self Care | Payer: BC Managed Care – PPO | Source: Ambulatory Visit | Attending: Obstetrics and Gynecology | Admitting: Obstetrics and Gynecology

## 2021-02-14 NOTE — Telephone Encounter (Signed)
She has had way less bleeding. She states that she hasn't had any bleeding since last Thursday (4 days).  She has had only the smallest amount of brown discharge yesterday. Nothing today. Will use premarin 0.5 grams twice weekly until her follow up.  Will hold on her procedure for tomorrow. Follow up 6/17 unless ZERO bleeding. Keep appt on 6/24.

## 2021-02-14 NOTE — Patient Instructions (Signed)
Your procedure is scheduled on: 02/15/21 Report to DAY SURGERY DEPARTMENT LOCATED ON 2ND FLOOR MEDICAL MALL ENTRANCE. To find out your arrival time please call [phone] between 1PM - 3PM on 02/14/21.  Remember: Instructions that are not followed completely may result in serious medical risk, up to and including death, or upon the discretion of your surgeon and anesthesiologist your surgery may need to be rescheduled.     _X__ 1. Do not eat food after midnight the night before your procedure.                 No gum chewing or hard candies. You may drink clear liquids up to 2 hours                 before you are scheduled to arrive for your surgery- DO not drink clear                 liquids within 2 hours of the start of your surgery.                 Clear Liquids include:  water, apple juice without pulp, clear carbohydrate                 drink such as Clearfast or Gatorade, Black Coffee or Tea (Do not add                 anything to coffee or tea). Diabetics water only  __X__2.  On the morning of surgery brush your teeth with toothpaste and water, you                 may rinse your mouth with mouthwash if you wish.  Do not swallow any              toothpaste of mouthwash.     _X__ 3.  No Alcohol for 24 hours before or after surgery.   _X__ 4.  Do Not Smoke or use e-cigarettes For 24 Hours Prior to Your Surgery.                 Do not use any chewable tobacco products for at least 6 hours prior to                 surgery.  ____  5.  Bring all medications with you on the day of surgery if instructed.   __X__  6.  Notify your doctor if there is any change in your medical condition      (cold, fever, infections).     Do not wear jewelry, make-up, hairpins, clips or nail polish. Do not wear lotions, powders, or perfumes.  Do not shave 48 hours prior to surgery. Men may shave face and neck. Do not bring valuables to the hospital.    [facility] is not responsible for any belongings or  valuables.  Contacts, dentures/partials or body piercings may not be worn into surgery. Bring a case for your contacts, glasses or hearing aids, a denture cup will be supplied. Leave your suitcase in the car. After surgery it may be brought to your room. For patients admitted to the hospital, discharge time is determined by your treatment team.   Patients discharged the day of surgery will not be allowed to drive home.   Please read over the following fact sheets that you were given:     __X__ Take these medicines the morning of surgery with A SIP OF WATER:  1. none  2.   3.   4.  5.  6.  ____ Fleet Enema (as directed)   __X__ Use CHG Soap/SAGE wipes as directed  ____ Use inhalers on the day of surgery  ____ Stop metformin/Janumet/Farxiga 2 days prior to surgery    ____ Take 1/2 of usual insulin dose the night before surgery. No insulin the morning          of surgery.   ____ Stop Blood Thinners Coumadin/Plavix/Xarelto/Pleta/Pradaxa/Eliquis/Effient/Aspirin  on   Or contact your Surgeon, Cardiologist or Medical Doctor regarding  ability to stop your blood thinners  __X__ Stop Anti-inflammatories 7 days before surgery such as Advil, Ibuprofen, Motrin,  BC or Goodies Powder, Naprosyn, Naproxen, Aleve, Aspirin    __X__ Stop all herbal supplements, fish oil or vitamin E until after surgery.    ____ Bring C-Pap to the hospital.

## 2021-02-15 ENCOUNTER — Ambulatory Visit
Admission: RE | Admit: 2021-02-15 | Payer: BC Managed Care – PPO | Source: Home / Self Care | Admitting: Obstetrics and Gynecology

## 2021-02-15 ENCOUNTER — Encounter: Admission: RE | Payer: Self-pay | Source: Home / Self Care

## 2021-02-15 SURGERY — EXAM UNDER ANESTHESIA
Anesthesia: Choice

## 2021-02-18 ENCOUNTER — Ambulatory Visit: Payer: BC Managed Care – PPO | Admitting: Obstetrics and Gynecology

## 2021-02-25 ENCOUNTER — Other Ambulatory Visit: Payer: Self-pay

## 2021-02-25 ENCOUNTER — Ambulatory Visit: Payer: BC Managed Care – PPO | Admitting: Obstetrics and Gynecology

## 2021-02-25 ENCOUNTER — Encounter: Payer: Self-pay | Admitting: Obstetrics and Gynecology

## 2021-02-25 VITALS — BP 120/70 | Ht 63.0 in | Wt 126.0 lb

## 2021-02-25 DIAGNOSIS — Z09 Encounter for follow-up examination after completed treatment for conditions other than malignant neoplasm: Secondary | ICD-10-CM

## 2021-02-25 NOTE — Progress Notes (Signed)
   Postoperative Follow-up Patient presents post op from Total Laparoscopic Hysterectomy, bilateral salpingectomy, cystoscopy 6 weeks ago for menorrhagia with irregular cycle,cervical polyp, cervical stenosis, pelvic pain.  Subjective: Patient reports some improvement in her preop symptoms. Eating a regular diet without difficulty. The patient is not having any pain.  Activity: normal activities of daily living.  She denies fever, chills, nausea, and vomiting.  She had some vaginal bleeding since her surgery and has not had any vaginal bleeding in almost 2 weeks.   Objective: Vital Signs: BP 120/70   Ht 5\' 3"  (1.6 m)   Wt 126 lb (57.2 kg)   BMI 22.32 kg/m  Physical Exam Constitutional:      General: She is not in acute distress.    Appearance: Normal appearance.  Genitourinary:     Bladder and urethral meatus normal.     No lesions in the vagina.     Right Labia: No rash, tenderness, lesions or skin changes.    Left Labia: No tenderness, lesions, skin changes or rash.    No inguinal adenopathy present in the right or left side.    Pelvic Tanner Score: 5/5.    No vaginal discharge, erythema, bleeding, ulceration, granulation tissue or cuff induration.     No vaginal prolapse present.    No vaginal atrophy present.     Right Adnexa: not tender, not full and no mass present.    Left Adnexa: not tender, not full and no mass present.    Cervix is absent.     Uterus is absent.  HENT:     Head: Normocephalic and atraumatic.  Eyes:     General: No scleral icterus.    Conjunctiva/sclera: Conjunctivae normal.  Lymphadenopathy:     Lower Body: No right inguinal adenopathy. No left inguinal adenopathy.  Neurological:     General: No focal deficit present.     Mental Status: She is alert and oriented to person, place, and time.     Cranial Nerves: No cranial nerve deficit.  Psychiatric:        Mood and Affect: Mood normal.        Behavior: Behavior normal.        Judgment: Judgment  normal.     Assessment: 50 y.o. s/p above surgery progressing well.  Continue topical estrogen x 4 more weeks. Pelvic rest for a total of 12 weeks.   Plan: Patient has done well after surgery with no apparent complications.  I have discussed the post-operative course to date, and the expected progress moving forward.  The patient understands what complications to be concerned about.  I will see the patient in routine follow up, or sooner if needed.    Activity plan: as above. Increase slowly.  54, MD  02/25/2021, 9:30 AM

## 2021-03-23 ENCOUNTER — Other Ambulatory Visit: Payer: Self-pay | Admitting: Obstetrics and Gynecology

## 2021-03-23 DIAGNOSIS — Z1231 Encounter for screening mammogram for malignant neoplasm of breast: Secondary | ICD-10-CM

## 2021-05-18 ENCOUNTER — Other Ambulatory Visit: Payer: Self-pay | Admitting: Obstetrics and Gynecology

## 2021-05-18 ENCOUNTER — Ambulatory Visit
Admission: RE | Admit: 2021-05-18 | Discharge: 2021-05-18 | Disposition: A | Discharge status: Home / Self Care | Payer: BC Managed Care – PPO | Source: Ambulatory Visit | Attending: Obstetrics and Gynecology | Admitting: Obstetrics and Gynecology

## 2021-05-18 ENCOUNTER — Other Ambulatory Visit: Payer: Self-pay

## 2021-05-18 DIAGNOSIS — Z1231 Encounter for screening mammogram for malignant neoplasm of breast: Secondary | ICD-10-CM

## 2021-06-21 ENCOUNTER — Encounter: Payer: BC Managed Care – PPO | Admitting: Family Medicine

## 2021-06-23 ENCOUNTER — Encounter: Payer: Self-pay | Admitting: Family Medicine

## 2021-06-23 ENCOUNTER — Ambulatory Visit (INDEPENDENT_AMBULATORY_CARE_PROVIDER_SITE_OTHER): Payer: BC Managed Care – PPO | Admitting: Family Medicine

## 2021-06-23 ENCOUNTER — Other Ambulatory Visit: Payer: Self-pay

## 2021-06-23 VITALS — BP 108/80 | HR 79 | Temp 97.9°F | Resp 16 | Ht 63.0 in | Wt 128.8 lb

## 2021-06-23 DIAGNOSIS — Z0000 Encounter for general adult medical examination without abnormal findings: Secondary | ICD-10-CM

## 2021-06-23 DIAGNOSIS — E559 Vitamin D deficiency, unspecified: Secondary | ICD-10-CM | POA: Diagnosis not present

## 2021-06-23 DIAGNOSIS — J069 Acute upper respiratory infection, unspecified: Secondary | ICD-10-CM | POA: Diagnosis not present

## 2021-06-23 DIAGNOSIS — E782 Mixed hyperlipidemia: Secondary | ICD-10-CM

## 2021-06-23 NOTE — Patient Instructions (Signed)
   The CDC recommends two doses of Shingrix (the shingles vaccine) separated by 2 to 6 months for adults age 50 years and older. I recommend checking with your insurance plan regarding coverage for this vaccine.   

## 2021-06-23 NOTE — Progress Notes (Signed)
Complete physical exam   Patient: Tanya Cochran   DOB: December 04, 1970   50 y.o. Female  MRN: [id number] Visit Date: 06/23/2021  Today's healthcare provider: Shirlee Latch, MD   Chief Complaint  Patient presents with   Annual Exam   URI   Subjective    Tanya Cochran is a 50 y.o. female who presents today for a complete physical exam.  She reports consuming a general diet. The patient does not participate in regular exercise at present. She generally feels poorly. She reports sleeping well. She does not have additional problems to discuss today.  HPI  12/26/20 Pap-normal 05/18/21 Mammogram-BI-RADS 1  Upper respiratory symptoms She complains of congestion, nasal congestion, sinus pressure, and tooth pain.with no fever, chills, night sweats or weight loss. Onset of symptoms was a few days ago and staying constant.She is drinking plenty of fluids.  Past history is significant for no history of pneumonia or bronchitis. Patient is non-smoker. She reports symptoms are worse during the night. Patient reports taking Ibuprofen and Sudafed, reports mild symptom control.   Daughter is sick with similar symptoms.  Low grade fever 4 days ago - gone now.  No SOB.   Present x5d ---------------------------------------------------------------------------------------------------   Past Medical History:  Diagnosis Date   Bipolar disorder (HCC)    COVID-19 10/29/2020   Fibromyalgia    History of methicillin resistant staphylococcus aureus (MRSA)    YEARS AGO FROM SPIDER BITE   Irritable bowel syndrome (IBS)    Past Surgical History:  Procedure Laterality Date   AUGMENTATION MAMMAPLASTY  09/04/2008   BREAST ENHANCEMENT SURGERY  2010   CYSTOSCOPY N/A 01/13/2021   Procedure: CYSTOSCOPY;  Surgeon: Conard Novak, MD;  Location: [facility];  Service: Gynecology;  Laterality: N/A;   DILATION AND CURETTAGE OF UTERUS  2006   laproscropic ovarion cyst  1999   TOTAL LAPAROSCOPIC HYSTERECTOMY WITH  SALPINGECTOMY Bilateral 01/13/2021   Procedure: TOTAL LAPAROSCOPIC HYSTERECTOMY WITH SALPINGECTOMY;  Surgeon: Conard Novak, MD;  Location: [facility];  Service: Gynecology;  Laterality: Bilateral;   WISDOM TOOTH EXTRACTION  1995   Social History   Socioeconomic History   Marital status: Divorced    Spouse name: Not on file   Number of children: Not on file   Years of education: Not on file   Highest education level: Not on file  Occupational History   Not on file  Tobacco Use   Smoking status: Never   Smokeless tobacco: Never  Vaping Use   Vaping Use: Never used  Substance and Sexual Activity   Alcohol use: Yes    Alcohol/week: 0.0 standard drinks    Comment: occasional   Drug use: No   Sexual activity: Yes    Birth control/protection: Surgical    Comment: thoughts of vasectomy  Other Topics Concern   Not on file  Social History Narrative   Not on file   Social Determinants of Health   Financial Resource Strain: Not on file  Food Insecurity: Not on file  Transportation Needs: Not on file  Physical Activity: Not on file  Stress: Not on file  Social Connections: Not on file  Intimate Partner Violence: Not on file   Family Status  Relation Name Status   Mother  Alive   Father  Alive   Brother  Deceased   MGM  Deceased   MGF  Deceased   PGM  Deceased   PGF  Deceased   Neg Hx  (Not Specified)  Family History  Problem Relation Age of Onset   Diabetes Mother        non-insulin dependent   Aortic aneurysm Mother    Heart disease Father    Atrial fibrillation Father    ALS Maternal Grandmother    Aortic aneurysm Maternal Grandfather    Prostate cancer Maternal Grandfather        prostate   Stroke Paternal Grandmother    Aortic aneurysm Paternal Grandfather    Colon cancer Neg Hx    Breast cancer Neg Hx    Ovarian cancer Neg Hx    No Known Allergies  Patient Care Team: Erasmo Downer, MD as PCP - General (Family Medicine)    Medications: Outpatient Medications Prior to Visit  Medication Sig   cholecalciferol (VITAMIN D) 25 MCG (1000 UNIT) tablet Take 1,000 Units by mouth daily.   docusate sodium (COLACE) 100 MG capsule Take 100 mg by mouth daily.   ibuprofen (ADVIL) 600 MG tablet Take 1 tablet (600 mg total) by mouth every 6 (six) hours.   lamoTRIgine (LAMICTAL) 200 MG tablet Take 200 mg by mouth at bedtime.   loratadine (CLARITIN) 10 MG tablet Take 10 mg by mouth daily as needed for allergies.   Multiple Vitamins-Minerals (MULTIVITAMIN WITH MINERALS) tablet Take 1 tablet by mouth daily. Woman   QUEtiapine Fumarate (SEROQUEL XR) 150 MG 24 hr tablet Take 150 mg by mouth at bedtime.   No facility-administered medications prior to visit.    Review of Systems  Constitutional: Negative.   HENT:  Positive for congestion, sinus pressure, sneezing and sore throat.   Eyes: Negative.   Respiratory:  Positive for cough.   Cardiovascular: Negative.   Gastrointestinal: Negative.   Endocrine: Negative.   Genitourinary: Negative.   Musculoskeletal: Negative.   Skin: Negative.   Allergic/Immunologic: Negative.   Neurological: Negative.   Hematological: Negative.   Psychiatric/Behavioral: Negative.     Last CBC Lab Results  Component Value Date   WBC 5.2 01/11/2021   HGB 14.9 01/11/2021   HCT 42.8 01/11/2021   MCV 92.6 01/11/2021   MCH 32.3 01/11/2021   RDW 13.1 01/11/2021   PLT 268 01/11/2021   Last metabolic panel Lab Results  Component Value Date   GLUCOSE 92 01/11/2021   NA 140 01/11/2021   K 3.6 01/11/2021   CL 105 01/11/2021   CO2 27 01/11/2021   BUN 11 01/11/2021   CREATININE 0.88 01/11/2021   GFRNONAA >60 01/11/2021   CALCIUM 9.0 01/11/2021   PROT 6.9 01/11/2021   ALBUMIN 3.9 01/11/2021   LABGLOB 2.6 06/21/2020   AGRATIO 1.6 06/21/2020   BILITOT 0.6 01/11/2021   ALKPHOS 42 01/11/2021   AST 17 01/11/2021   ALT 15 01/11/2021   ANIONGAP 8 01/11/2021   Last lipids Lab Results   Component Value Date   CHOL 209 (H) 06/21/2020   HDL 51 06/21/2020   LDLCALC 143 (H) 06/21/2020   TRIG 84 06/21/2020   CHOLHDL 4.1 06/21/2020   Last hemoglobin A1c No results found for: HGBA1C Last thyroid functions Lab Results  Component Value Date   TSH 2.140 06/21/2020      Objective    BP 108/80 (BP Location: Left Arm, Patient Position: Sitting, Cuff Size: Large)   Pulse 79   Temp 97.9 F (36.6 C) (Temporal)   Resp 16   Ht 5\' 3"  (1.6 m)   Wt 128 lb 12.8 oz (58.4 kg)   LMP 11/01/2020 (Approximate)   SpO2 99%   BMI  22.82 kg/m  BP Readings from Last 3 Encounters:  06/23/21 108/80  02/25/21 120/70  02/07/21 90/60   Wt Readings from Last 3 Encounters:  06/23/21 128 lb 12.8 oz (58.4 kg)  02/25/21 126 lb (57.2 kg)  02/14/21 127 lb (57.6 kg)      Physical Exam Vitals reviewed.  Constitutional:      General: She is not in acute distress.    Appearance: Normal appearance. She is well-developed. She is not diaphoretic.  HENT:     Head: Normocephalic and atraumatic.     Right Ear: Tympanic membrane, ear canal and external ear normal.     Left Ear: Tympanic membrane, ear canal and external ear normal.     Nose: Nose normal.     Mouth/Throat:     Mouth: Mucous membranes are moist.     Pharynx: Oropharynx is clear. No oropharyngeal exudate.  Eyes:     General: No scleral icterus.    Conjunctiva/sclera: Conjunctivae normal.     Pupils: Pupils are equal, round, and reactive to light.  Neck:     Thyroid: No thyromegaly.  Cardiovascular:     Rate and Rhythm: Normal rate and regular rhythm.     Pulses: Normal pulses.     Heart sounds: Normal heart sounds. No murmur heard. Pulmonary:     Effort: Pulmonary effort is normal. No respiratory distress.     Breath sounds: Normal breath sounds. No wheezing or rales.  Abdominal:     General: There is no distension.     Palpations: Abdomen is soft.     Tenderness: There is no abdominal tenderness.  Musculoskeletal:         General: No deformity.     Cervical back: Neck supple.     Right lower leg: No edema.     Left lower leg: No edema.  Lymphadenopathy:     Cervical: No cervical adenopathy.  Skin:    General: Skin is warm and dry.     Findings: No rash.  Neurological:     Mental Status: She is alert and oriented to person, place, and time. Mental status is at baseline.     Sensory: No sensory deficit.     Motor: No weakness.     Gait: Gait normal.  Psychiatric:        Mood and Affect: Mood normal.        Behavior: Behavior normal.        Thought Content: Thought content normal.      Last depression screening scores PHQ 2/9 Scores 06/23/2021 06/15/2020 06/04/2019  PHQ - 2 Score 0 0 1  PHQ- 9 Score 0 0 2   Last fall risk screening Fall Risk  06/23/2021  Falls in the past year? 0  Number falls in past yr: 0  Injury with Fall? 0  Risk for fall due to : No Fall Risks  Follow up Falls evaluation completed   Last Audit-C alcohol use screening Alcohol Use Disorder Test (AUDIT) 06/23/2021  1. How often do you have a drink containing alcohol? 2  2. How many drinks containing alcohol do you have on a typical day when you are drinking? 0  3. How often do you have six or more drinks on one occasion? 0  AUDIT-C Score 2  Alcohol Brief Interventions/Follow-up -   A score of 3 or more in women, and 4 or more in men indicates increased risk for alcohol abuse, EXCEPT if all of the points are from question  1   No results found for any visits on 06/23/21.  Assessment & Plan    Routine Health Maintenance and Physical Exam  Exercise Activities and Dietary recommendations  Goals   None     Immunization History  Administered Date(s) Administered   Influenza,inj,Quad PF,6+ Mos 05/30/2018, 06/04/2019   Influenza-Unspecified 06/22/2021   Tdap 05/30/2018    Health Maintenance  Topic Date Due   Pneumococcal Vaccine 80-54 Years old (1 - PCV) Never done   Zoster Vaccines- Shingrix (1 of 2) Never done    COLONOSCOPY (Pts 45-8yrs Insurance coverage will need to be confirmed)  Never done   COVID-19 Vaccine (1) 07/09/2021 (Originally 11/25/1971)   MAMMOGRAM  05/19/2023   TETANUS/TDAP  05/30/2028   INFLUENZA VACCINE  Completed   Hepatitis C Screening  Completed   HIV Screening  Completed   HPV VACCINES  Aged Out   PAP SMEAR-Modifier  Discontinued    Discussed health benefits of physical activity, and encouraged her to engage in regular exercise appropriate for her age and condition.  Problem List Items Addressed This Visit       Other   Avitaminosis D   Relevant Orders   VITAMIN D 25 Hydroxy (Vit-D Deficiency, Fractures)   Hyperlipidemia   Relevant Orders   Lipid panel   Comprehensive metabolic panel   Other Visit Diagnoses     Encounter for annual physical exam    -  Primary   Relevant Orders   Lipid panel   Comprehensive metabolic panel   VITAMIN D 25 Hydroxy (Vit-D Deficiency, Fractures)   Viral URI          - symptoms and exam c/w viral URI - no evidence of strep pharyngitis, CAP, AOM, bacterial sinusitis, or other bacterial infection - advised to take COVID test - discussed symptomatic management, natural course, and return precautions   Return in about 1 year (around 06/23/2022) for CPE.     I, Shirlee Latch, MD, have reviewed all documentation for this visit. The documentation on 06/23/21 for the exam, diagnosis, procedures, and orders are all accurate and complete.   Shazia Mitchener, Marzella Schlein, MD, MPH [facility]

## 2021-07-21 DIAGNOSIS — F3181 Bipolar II disorder: Secondary | ICD-10-CM | POA: Diagnosis not present

## 2021-09-25 IMAGING — US US PELVIS COMPLETE
1 series · 15 of 25 positions shown · non-contrast
Comparison: 12/27/2020

CLINICAL DATA: Vaginal bleeding post hysterectomy on 01/13/2021

EXAM:
TRANSABDOMINAL ULTRASOUND OF PELVIS
TECHNIQUE: Transabdominal ultrasound examination of the pelvis was performed
including evaluation of the uterus, ovaries, adnexal regions, and
pelvic cul-de-sac.

[Series 1: us pelvis complete · 48 acquisitions, 15 frames shown]
[im 1/48]
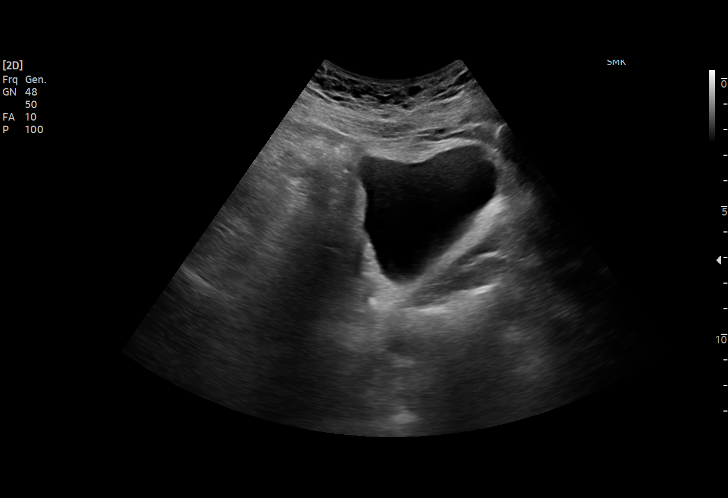
[im 4/48]
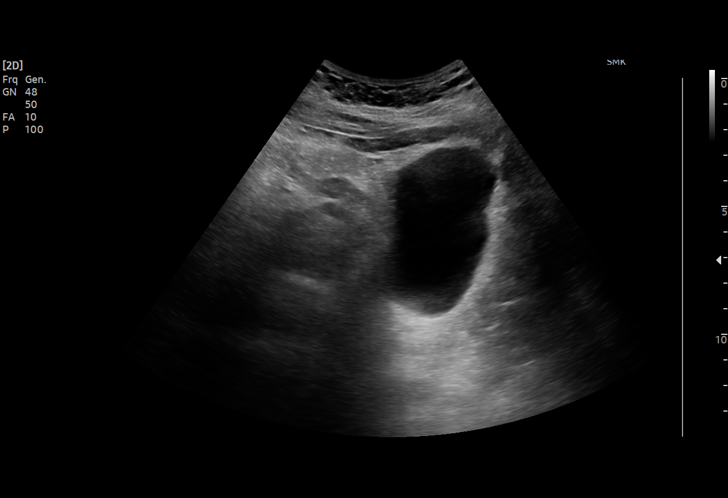
[im 8/48]
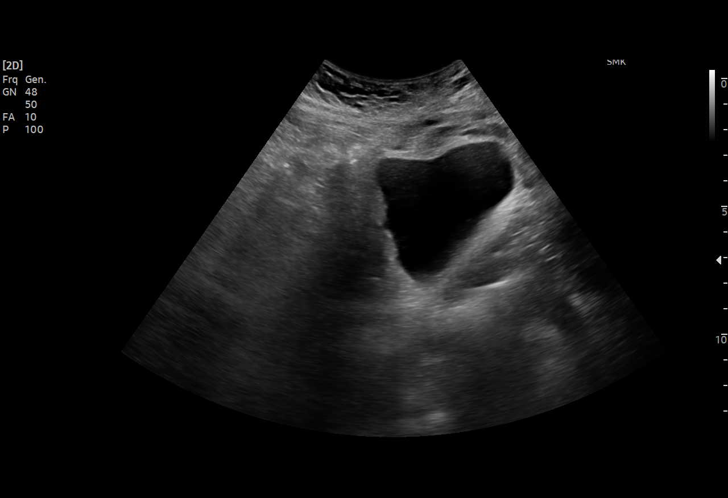
[im 10/48]
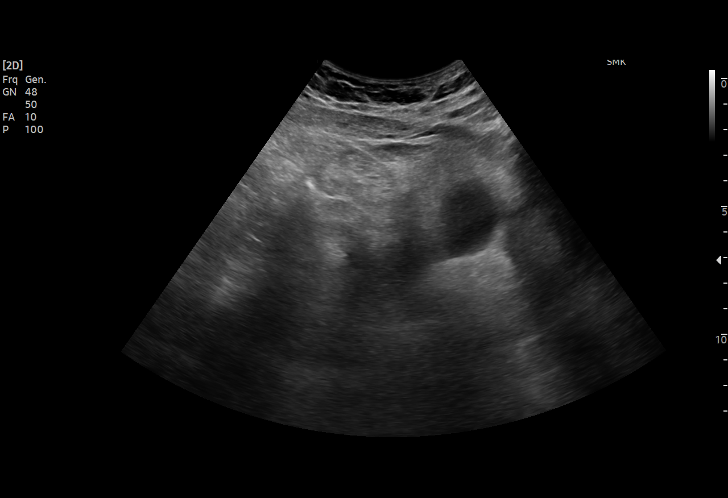
[im 14/48]
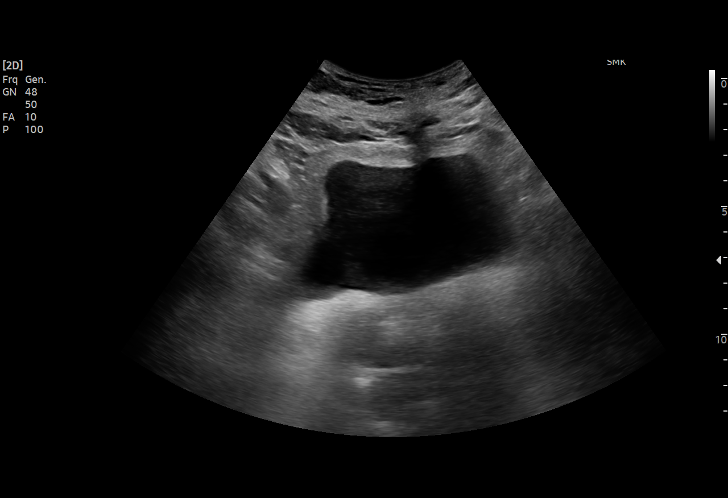
[im 18/48]
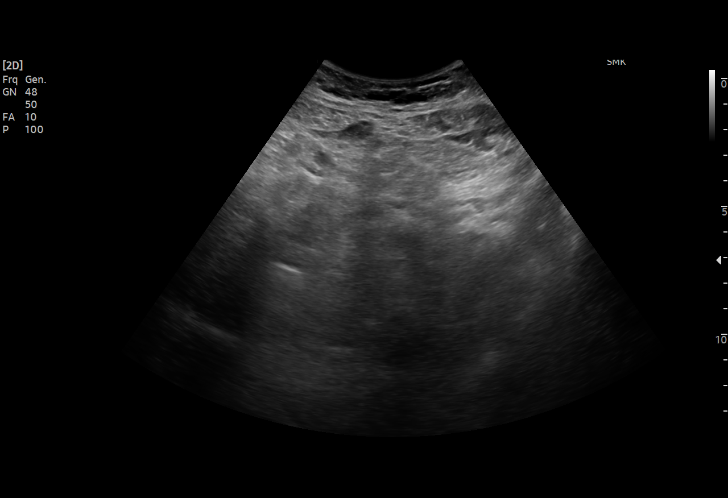
[im 20/48]
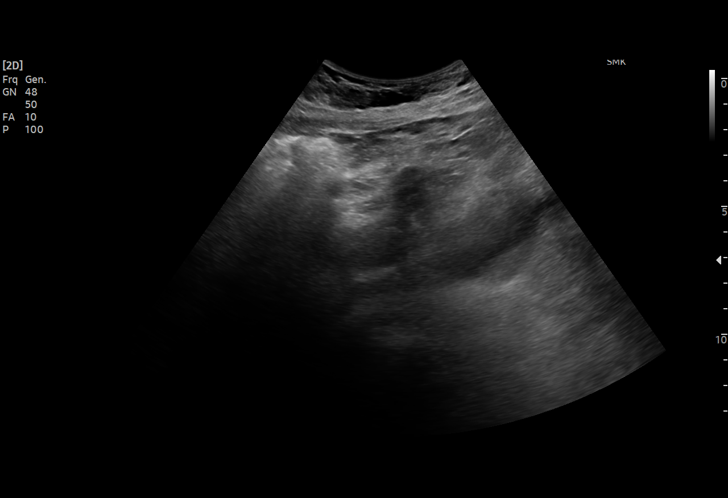
[im 24/48]
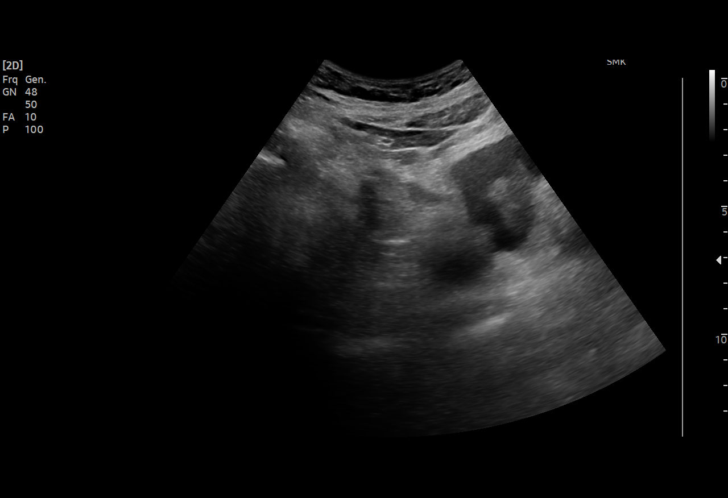
[im 28/48]
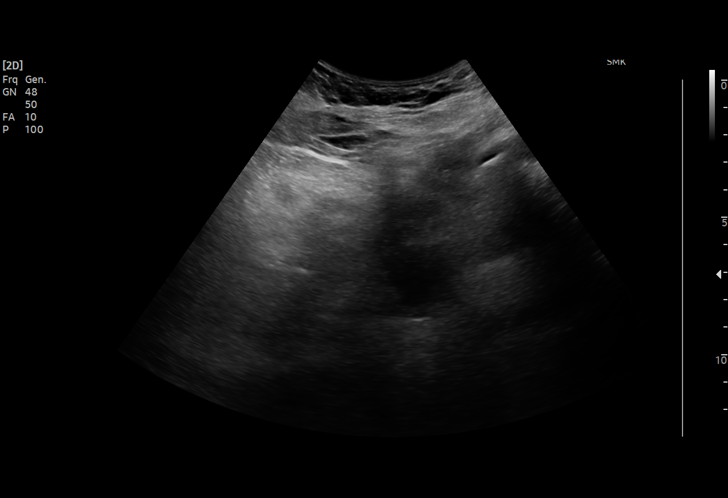
[im 30/48]
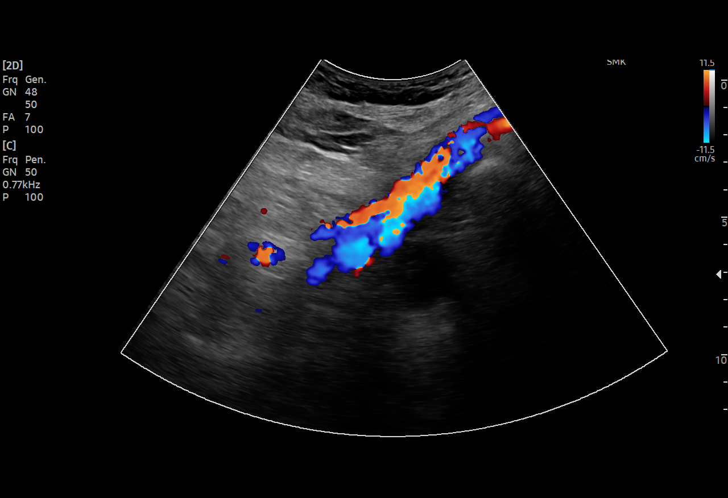
[im 34/48]
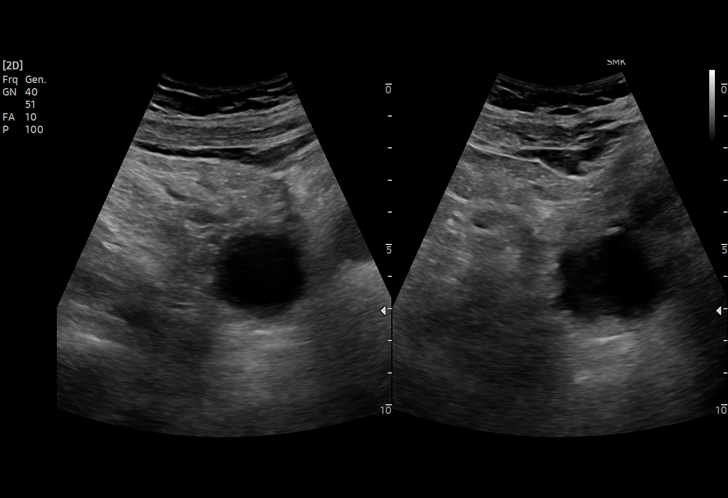
[im 38/48]
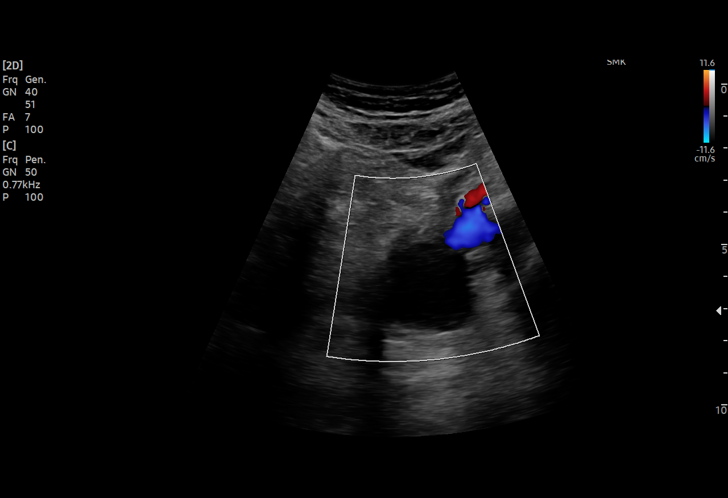
[im 40/48]
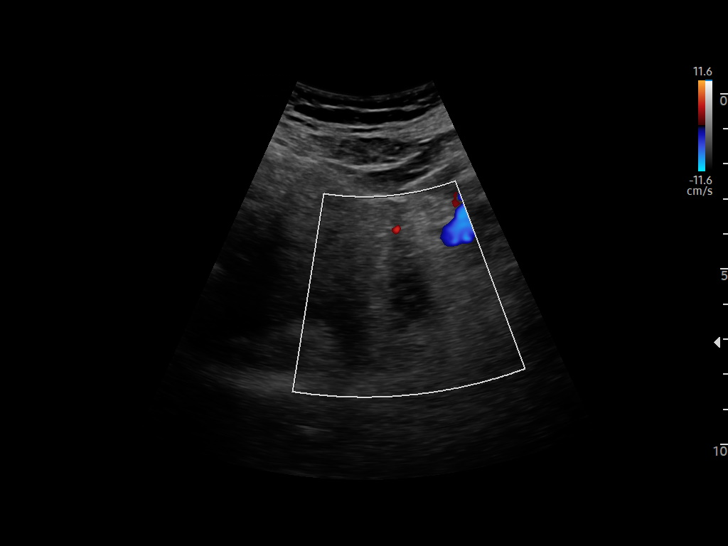
[im 44/48]
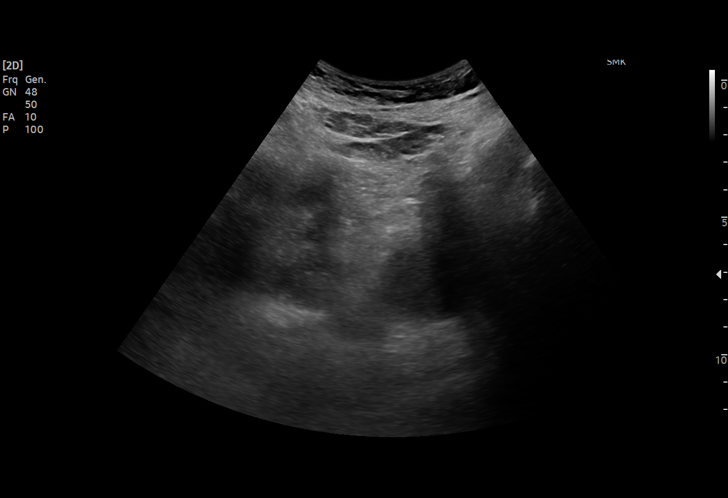
[im 48/48]
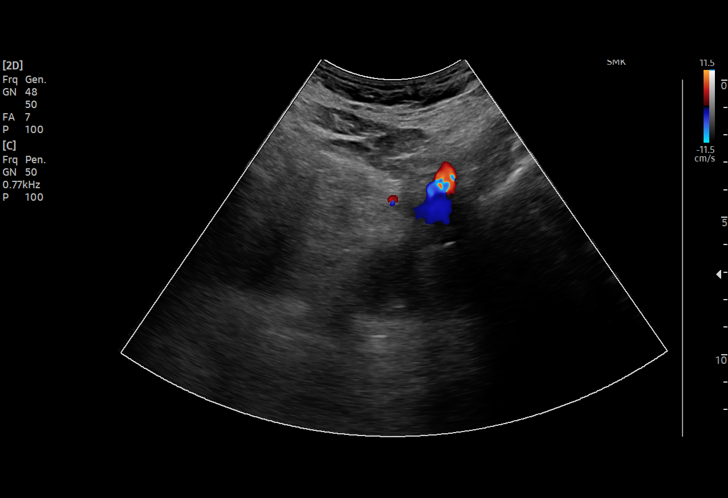

[15 of 25 positions shown; findings below may reference images not displayed]

FINDINGS: Uterus

Surgically absent

Endometrium

Surgically absent

Right ovary

Not visualized, likely obscured by bowel

Left ovary

Not visualized, likely obscured by bowel

Other findings: Bladder unremarkable. Small cyst identified within
LEFT adnexa 2.8 x 2.7 x 3.3 cm question versus paraovarian cyst. No
free pelvic fluid. No masses or definite hematoma seen.
IMPRESSION: Post hysterectomy.

3.3 cm diameter cyst versus paraovarian cyst in LEFT adnexa; No
follow up imaging recommended. Note: This recommendation does not
apply to premenarchal patients or to those with increased risk
(genetic, family history, elevated tumor markers or other high-risk
factors) of ovarian cancer. Reference: Radiology [DATE];

## 2021-10-07 DIAGNOSIS — N941 Unspecified dyspareunia: Secondary | ICD-10-CM | POA: Diagnosis not present

## 2021-10-07 DIAGNOSIS — Z01419 Encounter for gynecological examination (general) (routine) without abnormal findings: Secondary | ICD-10-CM | POA: Diagnosis not present

## 2021-10-07 DIAGNOSIS — Z1339 Encounter for screening examination for other mental health and behavioral disorders: Secondary | ICD-10-CM | POA: Diagnosis not present

## 2021-10-07 DIAGNOSIS — Z1331 Encounter for screening for depression: Secondary | ICD-10-CM | POA: Diagnosis not present

## 2021-10-26 DIAGNOSIS — N83201 Unspecified ovarian cyst, right side: Secondary | ICD-10-CM | POA: Diagnosis not present

## 2021-10-26 DIAGNOSIS — N941 Unspecified dyspareunia: Secondary | ICD-10-CM | POA: Diagnosis not present

## 2021-10-26 DIAGNOSIS — R102 Pelvic and perineal pain: Secondary | ICD-10-CM | POA: Diagnosis not present

## 2021-11-10 ENCOUNTER — Other Ambulatory Visit: Payer: Self-pay

## 2021-11-10 ENCOUNTER — Ambulatory Visit: Payer: BC Managed Care – PPO | Attending: Obstetrics and Gynecology | Admitting: Physical Therapy

## 2021-11-10 DIAGNOSIS — M62838 Other muscle spasm: Secondary | ICD-10-CM | POA: Insufficient documentation

## 2021-11-10 DIAGNOSIS — R278 Other lack of coordination: Secondary | ICD-10-CM

## 2021-11-10 DIAGNOSIS — R102 Pelvic and perineal pain: Secondary | ICD-10-CM

## 2021-11-10 NOTE — Therapy (Signed)
[facility] [address] [place], [place], [address] Phone: [phone]   Fax:  [phone]  Physical Therapy Evaluation  Patient Details  Name: Tanya Cochran MRN: [id number] Date of Birth: 12/07/1970 Referring Provider (PT): Thomasene Mohair   Encounter Date: 11/10/2021   PT End of Session - 11/10/21 1346     Visit Number 1    Number of Visits 12    Date for PT Re-Evaluation 02/02/22    Authorization Type IE 11/10/2021    PT Start Time 1030    PT Stop Time 1110    PT Time Calculation (min) 40 min    Activity Tolerance Patient tolerated treatment well    Behavior During Therapy [facility] for tasks assessed/performed             Past Medical History:  Diagnosis Date   Bipolar disorder (HCC)    COVID-19 10/29/2020   Fibromyalgia    History of methicillin resistant staphylococcus aureus (MRSA)    YEARS AGO FROM SPIDER BITE   Irritable bowel syndrome (IBS)     Past Surgical History:  Procedure Laterality Date   AUGMENTATION MAMMAPLASTY  09/04/2008   BREAST ENHANCEMENT SURGERY  2010   CYSTOSCOPY N/A 01/13/2021   Procedure: CYSTOSCOPY;  Surgeon: Conard Novak, MD;  Location: [facility] ORS;  Service: Gynecology;  Laterality: N/A;   DILATION AND CURETTAGE OF UTERUS  2006   laproscropic ovarion cyst  1999   TOTAL LAPAROSCOPIC HYSTERECTOMY WITH SALPINGECTOMY Bilateral 01/13/2021   Procedure: TOTAL LAPAROSCOPIC HYSTERECTOMY WITH SALPINGECTOMY;  Surgeon: Conard Novak, MD;  Location: [facility] ORS;  Service: Gynecology;  Laterality: Bilateral;   WISDOM TOOTH EXTRACTION  1995    There were no vitals filed for this visit.        [facility] PT Assessment - 11/10/21 1033       Assessment   Medical Diagnosis dyspareunia    Referring Provider (PT) Thomasene Mohair    Onset Date/Surgical Date 01/13/21    Hand Dominance Right    Prior Therapy None for this dx      Balance Screen   Has the patient fallen in the past 6 months No             PELVIC HEALTH PHYSICAL THERAPY EVALUATION  SCREENING Red Flags: None Have you had any night sweats? Unexplained weight loss? Saddle anesthesia? Unexplained changes in bowel or bladder habits?  Precautions: None  SUBJECTIVE  Chief Complaint: Patient notes that right after TLH she had a bleed 2-3 weeks post-op. Patient notes that she has had pain with penetration both for internal pelvic exam and with sexual activity. Patient notes that she also was found to have a cyst on the right ovary recently. She does endorse the majority of pain on the R with penetration.  Pertinent History:  Falls Negative.  Scoliosis Negative. Pulmonary disease/dysfunction Negative. Surgical history: Positive for see above.   Obstetrical History: G3P2 Deliveries: SVD, c-section Tearing/Episiotomy: grade 2  Gynecological History: Hysterectomy: Yes  Endometriosis: Negative Pelvic Organ Prolapse: Negative Pain with exam: Yes Heaviness/pressure: No   Urinary History: Incontinence: Positive. Onset: after childbirth (~20 years) Triggers: sneezing, jumping, running. Amount: Min. Protective undergarments: No  Fluid Intake: 32-40 oz H20, 18 oz caffeinated, occasional diet sodas Nocturia: 0x/night Frequency of urination: every 3-4 hours at work; every 15 minutes with travel Pain with urination: Negative Difficulty initiating urination: Negative Intermittent stream: Positive for incomplete emptying sensation. Frequent UTI: Negative.   Gastrointestinal History: Patient denies  any concerns.  Frequency of BMs: 1x/day Pain with defecation: Negative Straining with defecation: Negative   Sexual activity/pain: Pain with intercourse: Negative.   Initial penetration: Yes  Deep thrusting: Yes but the pain is closer to the entrance on the R.  External stimulation: No Change in ability achieve orgasm: No  Location of pain: R vaginal wall Current pain:  0/10  Max pain:  8-9/10 Least pain:   0/10 Pain quality: pain quality: electric shock Radiating pain: No    Patient Goals:  Participate fully in sexual activities   OBJECTIVE  Mental Status Patient is oriented to person, place and time.  Recent memory is intact.  Remote memory is intact.  Attention span and concentration are intact.  Expressive speech is intact.  Patient's fund of knowledge is within normal limits for educational level.  POSTURE/OBSERVATIONS:  Lumbar lordosis: WNL Thoracic kyphosis: increased Iliac crest height: not formally assessed  Lumbar lateral shift: not formally assessed Pelvic obliquity: not formally assessed Leg length discrepancy: not formally assessed  GAIT: Grossly WFL  RANGE OF MOTION: deferred 2/2 to time constraints   LEFT RIGHT  Lumbar forward flexion (65):      Lumbar extension (30):     Lumbar lateral flexion (25):     Thoracic and Lumbar rotation (30 degrees):       Hip Flexion (0-125):      Hip IR (0-45):     Hip ER (0-45):     Hip Abduction (0-40):     Hip extension (0-15):       SENSATION: deferred 2/2 to time constraints  STRENGTH: MMT deferred 2/2 to time constraints  RLE LLE  Hip Flexion    Hip Extension    Hip Abduction     Hip Adduction     Hip ER     Hip IR     Knee Extension    Knee Flexion    Dorsiflexion     Plantarflexion (seated)     ABDOMINAL: deferred 2/2 to time constraints Palpation: Diastasis: Scar mobility: Rib flare:  SPECIAL TESTS: deferred 2/2 to time constraints  PHYSICAL PERFORMANCE MEASURES: STS: WNL   EXTERNAL PELVIC EXAM: deferred 2/2 to time constraints Palpation: Breath coordination: Voluntary Contraction: present/absent Relaxation: full/delayed/non-relaxing Perineal movement with sustained IAP increase ("bear down"): descent/no change/elevation/excessive descent Perineal movement with rapid IAP increase ("cough"): elevation/no change/descent  INTERNAL VAGINAL EXAM: deferred 2/2 to time constraints Introitus  Appears:  Skin integrity:  Scar mobility: Strength (PERF):  Symmetry: Palpation: Prolapse:   INTERNAL RECTAL EXAM: not indicated Strength (PERF): Symmetry: Palpation: Prolapse:   OUTCOME MEASURES: FOTO (PFDI Pain 13)   ASSESSMENT Patient is a 51 year old presenting to clinic with chief complaints of pain with penetration and SUI. Today's evaluation is suggestive of deficits in PFM strength, PFM coordination, posture, and pain as evidenced by 8-9/10 pain with penetration, urinary leakage with impact and sudden increase in IAP, increased thoracic kyphosis. Patient's responses on FOTO outcome measures (PFDI Pain 13) indicate significant functional limitations/disability/distress. Patient's progress may be limited due to persistence of complaint and structural changes related to post-operative state; however, patient's motivation is advantageous. Patient will benefit from continued skilled therapeutic intervention to address deficits in PFM strength, PFM coordination, posture, and pain in order to increase function and improve overall QOL.  EDUCATION Patient educated on prognosis, POC, and provided with HEP including: not initiated. Patient articulated understanding and returned demonstration. Patient will benefit from further education in order to maximize compliance and understanding for long-term  therapeutic gains.    Objective measurements completed on examination: See above findings.          PT Long Term Goals - 11/10/21 1356       PT LONG TERM GOAL #1   Title Patient will demonstrate independence with HEP in order to maximize therapeutic gains and improve carryover from physical therapy sessions to ADLs in the home and community.    Baseline IE: not initiated    Time 12    Period Weeks    Status New    Target Date 02/02/22      PT LONG TERM GOAL #2   Title Patient will demonstrate independent and coordinated diaphragmatic breathing in supine with a 1:2 breathing  pattern for improved down-regulation of the nervous system and improved management of intra-abdominal pressures in order to increase function at home and in the community.    Baseline IE: not demonstrated    Time 12    Period Weeks    Status New      PT LONG TERM GOAL #3   Title Patient will decrease worst pain as reported on NPRS by at least 2 points to demonstrate clinically significant reduction in pain in order to restore/improve function and overall QOL.    Baseline IE: 8-9/10    Time 12    Period Weeks    Status New    Target Date 02/02/22      PT LONG TERM GOAL #4   Title Patient will demonstrate improved function as evidenced by a score of < 8 on FOTO measure for full participation in activities at home and in the community.    Baseline IE: 13    Time 12    Period Weeks    Status New    Target Date 02/02/22      PT LONG TERM GOAL #5   Title Patient will report less than 5 incidents of stress urinary incontinence over the course of 3 weeks while coughing/sneezing/laughing/high impact activity in order to demonstrate improved PFM coordination, strength, and function for improved overall QOL.    Baseline IE: 100% of sneezing, high impact    Time 12    Period Weeks    Status New    Target Date 02/02/22                    Plan - 11/10/21 1347     Clinical Impression Statement Patient is a 51 year old presenting to clinic with chief complaints of pain with penetration and SUI. Today's evaluation is suggestive of deficits in PFM strength, PFM coordination, posture, and pain as evidenced by 8-9/10 pain with penetration, urinary leakage with impact and sudden increase in IAP, increased thoracic kyphosis. Patient's responses on FOTO outcome measures (PFDI Pain 13) indicate significant functional limitations/disability/distress. Patient's progress may be limited due to persistence of complaint and structural changes related to post-operative state; however, patient's  motivation is advantageous. Patient will benefit from continued skilled therapeutic intervention to address deficits in PFM strength, PFM coordination, posture, and pain in order to increase function and improve overall QOL.    Personal Factors and Comorbidities Behavior Pattern;Comorbidity 3+;Time since onset of injury/illness/exacerbation    Comorbidities fibromyalgia, history of manic depressive disorder, IBS, hyperlipidemia,    Examination-Activity Limitations Continence;Other;Lift;Squat    Examination-Participation Restrictions Interpersonal Relationship;Community Activity    Stability/Clinical Decision Making Evolving/Moderate complexity    Clinical Decision Making Moderate    Rehab Potential Good    PT Frequency 1x /  week    PT Duration 12 weeks    PT Treatment/Interventions ADLs/Self Care Home Management;Biofeedback;Cryotherapy;Electrical Stimulation;Moist Heat;Therapeutic activities;Therapeutic exercise;Neuromuscular re-education;Patient/family education;Orthotic Fit/Training;Manual techniques;Scar mobilization;Dry needling;Taping;Spinal Manipulations;Joint Manipulations    PT Next Visit Plan physical assessment    PT Home Exercise Plan not initiated    Consulted and Agree with Plan of Care Patient             Patient will benefit from skilled therapeutic intervention in order to improve the following deficits and impairments:  Pain, Improper body mechanics, Decreased strength, Decreased coordination, Decreased activity tolerance, Decreased scar mobility, Decreased endurance  Visit Diagnosis: Pelvic pain  Other lack of coordination  Other muscle spasm     Problem List Patient Active Problem List   Diagnosis Date Noted   Menorrhagia with irregular cycle 01/13/2021   Cervical polyp 01/13/2021   Cervical stenosis (uterine cervix) 01/13/2021   Pelvic pain 01/13/2021   Hyperlipidemia 07/23/2020   Avitaminosis D 06/04/2019   Chronic fatigue 06/04/2019   Perimenopausal  06/04/2019   Leg cramps 06/04/2019   Irritable bowel syndrome 06/30/2015   Fibromyalgia 06/30/2015   H/O manic depressive disorder 06/30/2015    Sheria Lang PT, DPT [phone]  11/10/2021, 1:59 PM   [facility] [address] [place], [place], [address] Phone: [phone]   Fax:  [phone]  Name: Tanya Cochran MRN: [id number] Date of Birth: [birth year]

## 2021-11-17 ENCOUNTER — Encounter: Payer: Self-pay | Admitting: Physical Therapy

## 2021-11-17 ENCOUNTER — Ambulatory Visit: Payer: BC Managed Care – PPO | Admitting: Physical Therapy

## 2021-11-17 ENCOUNTER — Other Ambulatory Visit: Payer: Self-pay

## 2021-11-17 DIAGNOSIS — M62838 Other muscle spasm: Secondary | ICD-10-CM | POA: Diagnosis not present

## 2021-11-17 DIAGNOSIS — R102 Pelvic and perineal pain: Secondary | ICD-10-CM | POA: Diagnosis not present

## 2021-11-17 DIAGNOSIS — R278 Other lack of coordination: Secondary | ICD-10-CM | POA: Diagnosis not present

## 2021-11-17 NOTE — Therapy (Signed)
[place] ?[facility] ?[address] [place], [place], [address] ?Phone: [phone]   Fax:  [phone] ? ?Physical Therapy Treatment ? ?Patient Details  ?Name: Tanya Cochran ?MRN: [id number] ?Date of Birth: [birth year] ?Referring Provider (PT): Prentice Docker ? ? ?Encounter Date: 11/17/2021 ? ? PT End of Session - 11/17/21 0854   ? ? Visit Number 2   ? Number of Visits 12   ? Date for PT Re-Evaluation 02/02/22   ? Authorization Type IE 11/10/2021   ? PT Start Time 0900   ? PT Stop Time 0940   ? PT Time Calculation (min) 40 min   ? Activity Tolerance Patient tolerated treatment well   ? Behavior During Therapy [facility] for tasks assessed/performed   ? ?  ?  ? ?  ? ? ?Past Medical History:  ?Diagnosis Date  ? Bipolar disorder ([place])   ? COVID-19 10/29/2020  ? Fibromyalgia   ? History of methicillin resistant staphylococcus aureus (MRSA)   ? YEARS AGO FROM SPIDER BITE  ? Irritable bowel syndrome (IBS)   ? ? ?Past Surgical History:  ?Procedure Laterality Date  ? AUGMENTATION MAMMAPLASTY  09/04/2008  ? BREAST ENHANCEMENT SURGERY  2010  ? CYSTOSCOPY N/A 01/13/2021  ? Procedure: CYSTOSCOPY;  Surgeon: Will Bonnet, MD;  Location: [facility] ORS;  Service: Gynecology;  Laterality: N/A;  ? [place] OF UTERUS  2006  ? laproscropic ovarion cyst  1999  ? TOTAL LAPAROSCOPIC HYSTERECTOMY WITH SALPINGECTOMY Bilateral 01/13/2021  ? Procedure: TOTAL LAPAROSCOPIC HYSTERECTOMY WITH SALPINGECTOMY;  Surgeon: Will Bonnet, MD;  Location: [facility] ORS;  Service: Gynecology;  Laterality: Bilateral;  ? [place]  ? ? ?There were no vitals filed for this visit. ? ? Subjective Assessment - 11/17/21 0900   ? ? Subjective Patient notes no significant changes since last visit. She did attempt penetration and reports that it was slighlty improved as she tried to keep her nervou ssystem downtrained. Patient presents to clinic wearing high heels but clarifies that she is not a regular  heel wearer.   ? Currently in Pain? No/denies   ? ?  ?  ? ?  ? ? ? ? ? ? ? ?TREATMENT ? ?Pre-treatment assessment: L ASIS anterior; IC equal bilaterally. Adam's Test revealing R thoracic rotation ?RANGE OF MOTION:  ?  LEFT RIGHT  ?Lumbar forward flexion (65):  WNL    ?Lumbar extension (30): WNL    ?Lumbar lateral flexion (25):  WNL WNL  ?Thoracic and Lumbar rotation (30 degrees):    WNL WNL  ?Hip Flexion (0-125):   WNL WNL  ?Hip IR (0-45):  WNL WNL  ?Hip ER (0-45):  WNL WNL  ?Hip Abduction (0-40):  75% of R WNL  ?Hip extension (0-15):     ? ?Unable to dissociate thoracolumbar rotation from pelvis/hip complex.  ? ?STRENGTH: MMT  ? RLE LLE  ?Hip Flexion 5 5  ?Hip Extension 5* 5*  ?Hip Abduction  5 5  ?Hip Adduction  5 5  ?Hip ER  5 5  ?Hip IR  5 5  ?Knee Extension 5 5  ?Knee Flexion 5 4  ?Dorsiflexion  5 5  ?Plantarflexion (seated) 5 4  ?Patient over-estimates force needed during MMT suggesting upregulated nervous system. ? ?ABDOMINAL:  ?Palpation: no TTP ?Diastasis: 2 finger width at umbilicus, roughly 1 finger width superior and inferior to umbilicus ?Rib flare:90 degrees ? ?SPECIAL TESTS: ?SLR (SN 92, -LR 0.29): R: Negative L:  75% ROM of R ?FABER (SN 81): R: Negative L: Negative ?FADIR (SN 94): R: Negative L: Negative ? ? ?EXTERNAL PELVIC EXAM: Patient educated on the purpose of the pelvic exam and articulated understanding; patient consented to the exam verbally. ?Palpation: tension throughout ?Breath coordination: present ?Cued Lengthen: no perineal descent ?Cued Contraction: neural overflow to abdominals and adductor complexes, B. Able to isolate for 2/5 MMT with maximum cueing  ?Cued Relaxation: partial, full with increased cueing for diaphragmatic inhalation and with increased time ?Cough: no perineal movement ? ?Neuromuscular Re-education: ?Supine knee to chest with PFM lengthening, BLE, for improved PFM spasm release ?Supine double knee to chest with PFM lengthening for improved PFM spasm release ?Supine  butterfly with PFM lengthening, BLE, for improved PFM tissue length ?Patient education on posture/body mechanics and impact on anterior chain and PFM tension as well as subsequent symptoms related to increased PFM tension.  ? ? ?Patient educated throughout session on appropriate technique and form using multi-modal cueing, HEP, and activity modification. Patient articulated understanding and returned demonstration. ? ?Patient Response to interventions: ?Reports feeling the stretch at the PFM as well as suprapubically ? ?ASSESSMENT ?Patient presents to clinic with excellent motivation to participate in therapy. Patient demonstrates deficits in PFM strength, PFM coordination, posture, and pain. Patient able to achieve sensory awareness of PFM with coordinate breathing and LE stretches during today's session and responded positively to educational interventions. Patient will benefit from continued skilled therapeutic intervention to address remaining deficits in PFM strength, PFM coordination, posture, and pain in order to increase function and improve overall QOL. ? ? ? ? PT Long Term Goals - 11/10/21 1356   ? ?  ? PT LONG TERM GOAL #1  ? Title Patient will demonstrate independence with HEP in order to maximize therapeutic gains and improve carryover from physical therapy sessions to ADLs in the home and community.   ? Baseline IE: not initiated   ? Time 12   ? Period Weeks   ? Status New   ? Target Date 02/02/22   ?  ? PT LONG TERM GOAL #2  ? Title Patient will demonstrate independent and coordinated diaphragmatic breathing in supine with a 1:2 breathing pattern for improved down-regulation of the nervous system and improved management of intra-abdominal pressures in order to increase function at home and in the community.   ? Baseline IE: not demonstrated   ? Time 12   ? Period Weeks   ? Status New   ?  ? PT LONG TERM GOAL #3  ? Title Patient will decrease worst pain as reported on NPRS by at least 2 points to  demonstrate clinically significant reduction in pain in order to restore/improve function and overall QOL.   ? Baseline IE: 8-9/10   ? Time 12   ? Period Weeks   ? Status New   ? Target Date 02/02/22   ?  ? PT LONG TERM GOAL #4  ? Title Patient will demonstrate improved function as evidenced by a score of < 8 on FOTO measure for full participation in activities at home and in the community.   ? Baseline IE: 13   ? Time 12   ? Period Weeks   ? Status New   ? Target Date 02/02/22   ?  ? PT LONG TERM GOAL #5  ? Title Patient will report less than 5 incidents of stress urinary incontinence over the course of 3 weeks while coughing/sneezing/laughing/high impact activity in order to demonstrate improved  PFM coordination, strength, and function for improved overall QOL.   ? Baseline IE: 100% of sneezing, high impact   ? Time 12   ? Period Weeks   ? Status New   ? Target Date 02/02/22   ? ?  ?  ? ?  ? ? ? ? ? ? ? ? Plan - 11/17/21 0854   ? ? Clinical Impression Statement Patient presents to clinic with excellent motivation to participate in therapy. Patient demonstrates deficits in PFM strength, PFM coordination, posture, and pain. Patient able to achieve sensory awareness of PFM with coordinate breathing and LE stretches during today's session and responded positively to educational interventions. Patient will benefit from continued skilled therapeutic intervention to address remaining deficits in PFM strength, PFM coordination, posture, and pain in order to increase function and improve overall QOL.   ? Personal Factors and Comorbidities Behavior Pattern;Comorbidity 3+;Time since onset of injury/illness/exacerbation   ? Comorbidities fibromyalgia, history of manic depressive disorder, IBS, hyperlipidemia,   ? Examination-Activity Limitations Continence;Other;Lift;Squat   ? Examination-Participation Restrictions Interpersonal Relationship;Community Activity   ? Stability/Clinical Decision Making Evolving/Moderate  complexity   ? Rehab Potential Good   ? PT Frequency 1x / week   ? PT Duration 12 weeks   ? PT Treatment/Interventions ADLs/Self Care Home Management;Biofeedback;Cryotherapy;Electrical Stimulation;Moist Heat;Therapeut

## 2021-11-23 DIAGNOSIS — H5213 Myopia, bilateral: Secondary | ICD-10-CM | POA: Diagnosis not present

## 2021-11-24 ENCOUNTER — Other Ambulatory Visit: Payer: Self-pay

## 2021-11-24 ENCOUNTER — Ambulatory Visit: Payer: BC Managed Care – PPO | Admitting: Physical Therapy

## 2021-11-24 ENCOUNTER — Encounter: Payer: Self-pay | Admitting: Physical Therapy

## 2021-11-24 DIAGNOSIS — M62838 Other muscle spasm: Secondary | ICD-10-CM

## 2021-11-24 DIAGNOSIS — R102 Pelvic and perineal pain unspecified side: Secondary | ICD-10-CM

## 2021-11-24 DIAGNOSIS — R278 Other lack of coordination: Secondary | ICD-10-CM

## 2021-11-24 NOTE — Therapy (Signed)
[place] ?[facility] ?[address] [place], [place], [address] ?Phone: [phone]   Fax:  [phone] ? ?Physical Therapy Treatment ? ?Patient Details  ?Name: Tanya Cochran ?MRN: [id number] ?Date of Birth: October 29, 1970 ?Referring Provider (PT): Thomasene Mohair ? ? ?Encounter Date: 11/24/2021 ? ? PT End of Session - 11/24/21 0906   ? ? Visit Number 3   ? Number of Visits 12   ? Date for PT Re-Evaluation 02/02/22   ? Authorization Type IE 11/10/2021   ? PT Start Time 0900   ? PT Stop Time 0940   ? PT Time Calculation (min) 40 min   ? Activity Tolerance Patient tolerated treatment well   ? Behavior During Therapy [facility] for tasks assessed/performed   ? ?  ?  ? ?  ? ? ?Past Medical History:  ?Diagnosis Date  ? Bipolar disorder (HCC)   ? COVID-19 10/29/2020  ? Fibromyalgia   ? History of methicillin resistant staphylococcus aureus (MRSA)   ? YEARS AGO FROM SPIDER BITE  ? Irritable bowel syndrome (IBS)   ? ? ?Past Surgical History:  ?Procedure Laterality Date  ? AUGMENTATION MAMMAPLASTY  09/04/2008  ? BREAST ENHANCEMENT SURGERY  2010  ? CYSTOSCOPY N/A 01/13/2021  ? Procedure: CYSTOSCOPY;  Surgeon: Conard Novak, MD;  Location: [facility] OR[facility];  Service: Gynecology;  Laterality: N/A;  ? DILATION AND CURETTAGE OF UTERUS  2006  ? laproscropic ovarion cyst  1999  ? TOTAL LAPAROSCOPIC HYSTERECTOMY WITH SALPINGECTOMY Bilateral 01/13/2021  ? Procedure: TOTAL LAPAROSCOPIC HYSTERECTOMY WITH SALPINGECTOMY;  Surgeon: Conard Novak, MD;  Location: [facility];  Service: Gynecology;  Laterality: Bilateral;  ? WISDOM TOOTH EXTRACTION  1995  ? ? ?There were no vitals filed for this visit. ? ? Subjective Assessment - 11/24/21 0904   ? ? Subjective Patient reports that she is about 75% improved with dyspareunia symptoms. Has not had any sneezing fits or performed any high impact exercise so is unsure if SUI has improved.   ? Currently in Pain? No/denies   ? ?  ?  ? ?  ? ? ? ?TREATMENT ? ?Neuromuscular  Re-education: ?Reviewed supine PFM stretches at wall for hamstrings, adductors, and gluteals.  ?Standing L stretch with anterior pelvic tilt/coccyx extension and diaphragmatic breathing for improved release of PFM non-neurologic tone ?Standing adductor stretch with hip hinge and diaphragmatic breathing for improved release of PFM non-neurologic tone ?Standing figure 4 stretch with hip hinge and diaphragmatic breathing for improved release of PFM non-neurologic tone ? ? ?Patient educated throughout session on appropriate technique and form using multi-modal cueing, HEP, and activity modification. Patient articulated understanding and returned demonstration. ? ?Patient Response to interventions: ?Reports most apparent stretch only with adequate hip hinge ? ?ASSESSMENT ?Patient presents to clinic with excellent motivation to participate in therapy. Patient demonstrates deficits in PFM strength, PFM coordination, posture, and pain. Patient with preference for posteriorly tilted pelvis with standing stretches but able to correct with minimal-moderate cueing during today's session and responded positively to active interventions. Patient will benefit from continued skilled therapeutic intervention to address remaining deficits in PFM strength, PFM coordination, posture, and pain in order to increase function and improve overall QOL. ? ? ? ? PT Long Term Goals - 11/10/21 1356   ? ?  ? PT LONG TERM GOAL #1  ? Title Patient will demonstrate independence with HEP in order to maximize therapeutic gains and improve carryover from physical therapy sessions to ADLs in the home and community.   ?  Baseline IE: not initiated   ? Time 12   ? Period Weeks   ? Status New   ? Target Date 02/02/22   ?  ? PT LONG TERM GOAL #2  ? Title Patient will demonstrate independent and coordinated diaphragmatic breathing in supine with a 1:2 breathing pattern for improved down-regulation of the nervous system and improved management of  intra-abdominal pressures in order to increase function at home and in the community.   ? Baseline IE: not demonstrated   ? Time 12   ? Period Weeks   ? Status New   ?  ? PT LONG TERM GOAL #3  ? Title Patient will decrease worst pain as reported on NPRS by at least 2 points to demonstrate clinically significant reduction in pain in order to restore/improve function and overall QOL.   ? Baseline IE: 8-9/10   ? Time 12   ? Period Weeks   ? Status New   ? Target Date 02/02/22   ?  ? PT LONG TERM GOAL #4  ? Title Patient will demonstrate improved function as evidenced by a score of < 8 on FOTO measure for full participation in activities at home and in the community.   ? Baseline IE: 13   ? Time 12   ? Period Weeks   ? Status New   ? Target Date 02/02/22   ?  ? PT LONG TERM GOAL #5  ? Title Patient will report less than 5 incidents of stress urinary incontinence over the course of 3 weeks while coughing/sneezing/laughing/high impact activity in order to demonstrate improved PFM coordination, strength, and function for improved overall QOL.   ? Baseline IE: 100% of sneezing, high impact   ? Time 12   ? Period Weeks   ? Status New   ? Target Date 02/02/22   ? ?  ?  ? ?  ? ? ? ? ? ? ? ? Plan - 11/24/21 0907   ? ? Clinical Impression Statement Patient presents to clinic with excellent motivation to participate in therapy. Patient demonstrates deficits in PFM strength, PFM coordination, posture, and pain. Patient with preference for posteriorly tilted pelvis with standing stretches but able to correct with minimal-moderate cueing during today's session and responded positively to active interventions. Patient will benefit from continued skilled therapeutic intervention to address remaining deficits in PFM strength, PFM coordination, posture, and pain in order to increase function and improve overall QOL.   ? Personal Factors and Comorbidities Behavior Pattern;Comorbidity 3+;Time since onset of injury/illness/exacerbation   ?  Comorbidities fibromyalgia, history of manic depressive disorder, IBS, hyperlipidemia,   ? Examination-Activity Limitations Continence;Other;Lift;Squat   ? Examination-Participation Restrictions Interpersonal Relationship;Community Activity   ? Stability/Clinical Decision Making Evolving/Moderate complexity   ? Rehab Potential Good   ? PT Frequency 1x / week   ? PT Duration 12 weeks   ? PT Treatment/Interventions ADLs/Self Care Home Management;Biofeedback;Cryotherapy;Electrical Stimulation;Moist Heat;Therapeutic activities;Therapeutic exercise;Neuromuscular re-education;Patient/family education;Orthotic Fit/Training;Manual techniques;Scar mobilization;Dry needling;Taping;Spinal Manipulations;Joint Manipulations   ? PT Home Exercise Plan [email or web address]   ? Consulted and Agree with Plan of Care Patient   ? ?  ?  ? ?  ? ? ?Patient will benefit from skilled therapeutic intervention in order to improve the following deficits and impairments:  Pain, Improper body mechanics, Decreased strength, Decreased coordination, Decreased activity tolerance, Decreased scar mobility, Decreased endurance ? ?Visit Diagnosis: ?Pelvic pain ? ?Other lack of coordination ? ?Other muscle spasm ? ? ? ? ?Problem List ?Patient Active Problem List  ?  Diagnosis Date Noted  ? Menorrhagia with irregular cycle 01/13/2021  ? Cervical polyp 01/13/2021  ? Cervical stenosis (uterine cervix) 01/13/2021  ? Pelvic pain 01/13/2021  ? Hyperlipidemia 07/23/2020  ? Avitaminosis D 06/04/2019  ? Chronic fatigue 06/04/2019  ? Perimenopausal 06/04/2019  ? Leg cramps 06/04/2019  ? Irritable bowel syndrome 06/30/2015  ? Fibromyalgia 06/30/2015  ? H/O manic depressive disorder 06/30/2015  ? ? ?Sheria Lang PT, DPT [phone]  ?11/24/2021, 1:23 PM ? ?[place] ?[facility] ?[address] [place], [place], [address] ?Phone: [phone]   Fax:  [phone] ? ?Name: SHALIMAR MCCLAIN ?MRN: [id number] ?Date of Birth: 02/06/1971 ? ? ? ?

## 2021-12-01 ENCOUNTER — Ambulatory Visit: Payer: BC Managed Care – PPO | Admitting: Physical Therapy

## 2021-12-01 ENCOUNTER — Encounter: Payer: Self-pay | Admitting: Physical Therapy

## 2021-12-01 DIAGNOSIS — R278 Other lack of coordination: Secondary | ICD-10-CM

## 2021-12-01 DIAGNOSIS — M62838 Other muscle spasm: Secondary | ICD-10-CM | POA: Diagnosis not present

## 2021-12-01 DIAGNOSIS — R102 Pelvic and perineal pain: Secondary | ICD-10-CM

## 2021-12-01 NOTE — Therapy (Signed)
?OUTPATIENT PHYSICAL THERAPY TREATMENT NOTE ? ? ?Patient Name: Tanya Cochran ?MRN: [id number] ?DOB:08/13/1971, 51 y.o., female ?Today's Date: 12/01/2021 ? ?PCP: Virginia Crews, MD ?REFERRING PROVIDER: Will Bonnet, MD ? ? PT End of Session - 12/01/21 0902   ? ? Visit Number 4   ? Number of Visits 12   ? Date for PT Re-Evaluation 02/02/22   ? Authorization Type IE 11/10/2021   ? PT Start Time 0900   ? PT Stop Time 0940   ? PT Time Calculation (min) 40 min   ? Activity Tolerance Patient tolerated treatment well   ? Behavior During Therapy [facility] for tasks assessed/performed   ? ?  ?  ? ?  ? ? ?Past Medical History:  ?Diagnosis Date  ? Bipolar disorder ([place])   ? COVID-19 10/29/2020  ? Fibromyalgia   ? History of methicillin resistant staphylococcus aureus (MRSA)   ? YEARS AGO FROM SPIDER BITE  ? Irritable bowel syndrome (IBS)   ? ?Past Surgical History:  ?Procedure Laterality Date  ? AUGMENTATION MAMMAPLASTY  09/04/2008  ? BREAST ENHANCEMENT SURGERY  2010  ? CYSTOSCOPY N/A 01/13/2021  ? Procedure: CYSTOSCOPY;  Surgeon: Will Bonnet, MD;  Location: [facility];  Service: Gynecology;  Laterality: N/A;  ? [place] OF UTERUS  2006  ? laproscropic ovarion cyst  1999  ? TOTAL LAPAROSCOPIC HYSTERECTOMY WITH SALPINGECTOMY Bilateral 01/13/2021  ? Procedure: TOTAL LAPAROSCOPIC HYSTERECTOMY WITH SALPINGECTOMY;  Surgeon: Will Bonnet, MD;  Location: [facility];  Service: Gynecology;  Laterality: Bilateral;  ? [place]  ? ?Patient Active Problem List  ? Diagnosis Date Noted  ? Menorrhagia with irregular cycle 01/13/2021  ? Cervical polyp 01/13/2021  ? Cervical stenosis (uterine cervix) 01/13/2021  ? Pelvic pain 01/13/2021  ? Hyperlipidemia 07/23/2020  ? Avitaminosis D 06/04/2019  ? Chronic fatigue 06/04/2019  ? Perimenopausal 06/04/2019  ? Leg cramps 06/04/2019  ? Irritable bowel syndrome 06/30/2015  ? Fibromyalgia 06/30/2015  ? H/O manic depressive disorder 06/30/2015  ? ? ?REFERRING  DIAG: N94.10 (ICD-10-CM) - Unspecified dyspareunia  ? ?THERAPY DIAG:  ?Pelvic pain ? ?Other lack of coordination ? ?Other muscle spasm ? ?PERTINENT HISTORY: 01/13/2021 TL[facility] ? ?PRECAUTIONS: None ? ?SUBJECTIVE: Patient reports that everything is generally going well, but she does have new onset of urinary frequency. Patient denies any sense of urgency or UUI, but just feels she is getting the signal to pee more frequently. Patient endorses that she has not changed fluid consumption and has had no other significant changes. She also notes that with the more frequent voids she is not voiding a large volume each time.  ? ?PAIN:  ?Are you having pain? No ? ? ?TREATMENT ? ?Neuromuscular Re-education: ?Patient educated extensively on typical bladder function, typical bladder habits, basics of urge suppression, bladder irritants in order to better regulate bladder through behavioral changes.  ? ?Patient educated throughout session on appropriate technique and form using multi-modal cueing, HEP, and activity modification. Patient articulated understanding and returned demonstration. ? ?Patient Response to interventions: ?Appreciative of the information. ? ?ASSESSMENT ?Patient presents to clinic with excellent motivation to participate in therapy. Patient demonstrates deficits in PFM strength, PFM coordination, posture, and pain. Patient quite receptive to education on typical bladder function and urge suppression strategies during today's session and indicated comfort with follow-up in 2 weeks and continued HEP in the interim. Patient will benefit from continued skilled therapeutic intervention to address remaining deficits in PFM strength, PFM coordination, posture,  and pain in order to increase function and improve overall QOL. ? ? PT Long Term Goals   ? ?  ? PT LONG TERM GOAL #1  ? Title Patient will demonstrate independence with HEP in order to maximize therapeutic gains and improve carryover from physical therapy sessions  to ADLs in the home and community.   ? Baseline IE: not initiated   ? Time 12   ? Period Weeks   ? Status New   ? Target Date 02/02/22   ?  ? PT LONG TERM GOAL #2  ? Title Patient will demonstrate independent and coordinated diaphragmatic breathing in supine with a 1:2 breathing pattern for improved down-regulation of the nervous system and improved management of intra-abdominal pressures in order to increase function at home and in the community.   ? Baseline IE: not demonstrated   ? Time 12   ? Period Weeks   ? Status New   ?  ? PT LONG TERM GOAL #3  ? Title Patient will decrease worst pain as reported on NPRS by at least 2 points to demonstrate clinically significant reduction in pain in order to restore/improve function and overall QOL.   ? Baseline IE: 8-9/10   ? Time 12   ? Period Weeks   ? Status New   ? Target Date 02/02/22   ?  ? PT LONG TERM GOAL #4  ? Title Patient will demonstrate improved function as evidenced by a score of < 8 on FOTO measure for full participation in activities at home and in the community.   ? Baseline IE: 13   ? Time 12   ? Period Weeks   ? Status New   ? Target Date 02/02/22   ?  ? PT LONG TERM GOAL #5  ? Title Patient will report less than 5 incidents of stress urinary incontinence over the course of 3 weeks while coughing/sneezing/laughing/high impact activity in order to demonstrate improved PFM coordination, strength, and function for improved overall QOL.   ? Baseline IE: 100% of sneezing, high impact   ? Time 12   ? Period Weeks   ? Status New   ? Target Date 02/02/22   ? ?  ?  ? ?  ? ? Plan  ? ? Clinical Impression Statement Patient presents to clinic with excellent motivation to participate in therapy. Patient demonstrates deficits in PFM strength, PFM coordination, posture, and pain. Patient quite receptive to education on typical bladder function and urge suppression strategies during today's session and indicated comfort with follow-up in 2 weeks and continued HEP in the  interim. Patient will benefit from continued skilled therapeutic intervention to address remaining deficits in PFM strength, PFM coordination, posture, and pain in order to increase function and improve overall QOL.   ? Personal Factors and Comorbidities Behavior Pattern;Comorbidity 3+;Time since onset of injury/illness/exacerbation   ? Comorbidities fibromyalgia, history of manic depressive disorder, IBS, hyperlipidemia,   ? Examination-Activity Limitations Continence;Other;Lift;Squat   ? Examination-Participation Restrictions Interpersonal Relationship;Community Activity   ? Stability/Clinical Decision Making Evolving/Moderate complexity   ? Rehab Potential Good   ? PT Frequency 1x / week   ? PT Duration 12 weeks   ? PT Treatment/Interventions ADLs/Self Care Home Management;Biofeedback;Cryotherapy;Electrical Stimulation;Moist Heat;Therapeutic activities;Therapeutic exercise;Neuromuscular re-education;Patient/family education;Orthotic Fit/Training;Manual techniques;Scar mobilization;Dry needling;Taping;Spinal Manipulations;Joint Manipulations   ? PT Home Exercise Plan [email or web address]   ? Consulted and Agree with Plan of Care Patient   ? ?  ?  ? ?  ? ?Myles Gip PT, DPT #  [id number]  ?12/01/2021, 2:52 PM ? ?  ? ?

## 2021-12-07 ENCOUNTER — Encounter: Payer: BC Managed Care – PPO | Admitting: Physical Therapy

## 2021-12-08 ENCOUNTER — Encounter: Payer: BC Managed Care – PPO | Admitting: Physical Therapy

## 2021-12-15 ENCOUNTER — Encounter: Payer: Self-pay | Admitting: Physical Therapy

## 2021-12-15 ENCOUNTER — Ambulatory Visit: Payer: BC Managed Care – PPO | Attending: Obstetrics and Gynecology | Admitting: Physical Therapy

## 2021-12-15 DIAGNOSIS — R102 Pelvic and perineal pain: Secondary | ICD-10-CM | POA: Insufficient documentation

## 2021-12-15 DIAGNOSIS — R278 Other lack of coordination: Secondary | ICD-10-CM | POA: Diagnosis not present

## 2021-12-15 DIAGNOSIS — M62838 Other muscle spasm: Secondary | ICD-10-CM | POA: Diagnosis not present

## 2021-12-15 NOTE — Therapy (Signed)
?OUTPATIENT PHYSICAL THERAPY TREATMENT NOTE ? ? ?Patient Name: Tanya Cochran ?MRN: [id number] ?DOB:22-Feb-1971, 51 y.o., female, female ?Today's Date: 12/15/2021 ? ?PCP: Erasmo DownerBacigalupo, Angela M, MD ?REFERRING PROVIDER: Conard NovakJackson, Stephen D, MD ? ? PT End of Session - 12/15/21 0901   ? ? Visit Number 5   ? Number of Visits 12   ? Date for PT Re-Evaluation 02/02/22   ? Authorization Type IE 11/10/2021   ? PT Start Time 0900   ? PT Stop Time 0920   ? PT Time Calculation (min) 20 min   ? Activity Tolerance Patient tolerated treatment well   ? [facility]/performed   ? ?  ?  ? ?  ? ? ?Past Medical History:  ?Diagnosis Date  ? Bipolar disorder (HCC)   ? COVID-19 10/29/2020  ? Fibromyalgia   ? History of methicillin resistant staphylococcus aureus (MRSA)   ? YEARS AGO FROM SPIDER BITE  ? Irritable bowel syndrome (IBS)   ? ?Past Surgical History:  ?Procedure Laterality Date  ? AUGMENTATION MAMMAPLASTY  09/04/2008  ? BREAST ENHANCEMENT SURGERY  2010  ? CYSTOSCOPY N/A 01/13/2021  ? Procedure: CYSTOSCOPY;  Surgeon: Conard NovakJackson, Stephen D, MD;  Location: [facility];  Service: Gynecology;  Laterality: N/A;  ? DILATION AND CURETTAGE OF UTERUS  2006  ? laproscropic ovarion cyst  1999  ? TOTAL LAPAROSCOPIC HYSTERECTOMY WITH SALPINGECTOMY Bilateral 01/13/2021  ? Procedure: TOTAL LAPAROSCOPIC HYSTERECTOMY WITH SALPINGECTOMY;  Surgeon: Conard NovakJackson, Stephen D, MD;  Location: [facility];  Service: Gynecology;  Laterality: Bilateral;  ? WISDOM TOOTH EXTRACTION  1995  ? ?Patient Active Problem List  ? Diagnosis Date Noted  ? Menorrhagia with irregular cycle 01/13/2021  ? Cervical polyp 01/13/2021  ? Cervical stenosis (uterine cervix) 01/13/2021  ? Pelvic pain 01/13/2021  ? Hyperlipidemia 07/23/2020  ? Avitaminosis D 06/04/2019  ? Chronic fatigue 06/04/2019  ? Perimenopausal 06/04/2019  ? Leg cramps 06/04/2019  ? Irritable bowel syndrome 06/30/2015  ? Fibromyalgia 06/30/2015  ? H/O manic depressive disorder 06/30/2015  ? ? ?REFERRING  DIAG: N94.10 (ICD-10-CM) - Unspecified dyspareunia  ? ?THERAPY DIAG:  ?Pelvic pain ? ?Other lack of coordination ? ?Other muscle spasm ? ?PERTINENT HISTORY: 01/13/2021 [facility] ? ?PRECAUTIONS: None ? ?SUBJECTIVE: Patient reports that she has been doing better but still has to focus with urge suppression. Patient does note that she went to a sporting event and was able to store without issue for about 4 hours.  ? ?PAIN:  ?Are you having pain? No ? ? ?TREATMENT ? ?Neuromuscular Re-education: ?Reassessed goals; see below.  ? ?Patient educated throughout session on appropriate technique and form using multi-modal cueing, HEP, and activity modification. Patient articulated understanding and returned demonstration. ? ?Patient Response to interventions: ?Notes comfort with d/c.  ? ? ? ? PT Long Term Goals   ? ?  ? PT LONG TERM GOAL #1  ? Title Patient will demonstrate independence with HEP in order to maximize therapeutic gains and improve carryover from physical therapy sessions to ADLs in the home and community.   ? Baseline IE: not initiated; 4/13: IND  ? Time 12   ? Period Weeks   ? Status Achieved   ? Target Date 02/02/22   ?  ? PT LONG TERM GOAL #2  ? Title Patient will demonstrate independent and coordinated diaphragmatic breathing in supine with a 1:2 breathing pattern for improved down-regulation of the nervous system and improved management of intra-abdominal pressures in order to increase function at home and  in the community.   ? Baseline IE: not demonstrated ; 4/13: IND  ? Time 12   ? Period Weeks   ? Status Achieved   ?  ? PT LONG TERM GOAL #3  ? Title Patient will decrease worst pain as reported on NPRS by at least 2 points to demonstrate clinically significant reduction in pain in order to restore/improve function and overall QOL.   ? Baseline IE: 8-9/10; 4/13: 0/10  ? Time 12   ? Period Weeks   ? Status Achieved   ? Target Date 02/02/22   ?  ? PT LONG TERM GOAL #4  ? Title Patient will demonstrate improved  function as evidenced by a score of < 8 on FOTO measure for full participation in activities at home and in the community.   ? Baseline IE: 13 ; 4/13: 4  ? Time 12   ? Period Weeks   ? Status Achieved  ? Target Date 02/02/22   ?  ? PT LONG TERM GOAL #5  ? Title Patient will report less than 5 incidents of stress urinary incontinence over the course of 3 weeks while coughing/sneezing/laughing/high impact activity in order to demonstrate improved PFM coordination, strength, and function for improved overall QOL.   ? Baseline IE: 100% of sneezing, high impact; 4/13: none in past 3 weeks  ? Time 12   ? Period Weeks   ? Status Achieved   ? Target Date 02/02/22   ? ?  ?  ? ?  ? ? Plan  ? ? Clinical Impression Statement Patient presents to clinic with excellent motivation to participate in therapy. Patient demonstrates minimal or no deficits in PFM strength, PFM coordination, posture, and pain at this time. Patient has achieved all goals set forth in physical therapy and is appropriate for discharge to self-management.   ? Personal Factors and Comorbidities Behavior Pattern;Comorbidity 3+;Time since onset of injury/illness/exacerbation   ? Comorbidities fibromyalgia, history of manic depressive disorder, IBS, hyperlipidemia,   ? Examination-Activity Limitations Continence;Other;Lift;Squat   ? Examination-Participation Restrictions Interpersonal Relationship;Community Activity   ? Stability/Clinical Decision Making Evolving/Moderate complexity   ? Rehab Potential Good   ? PT Frequency 1x / week   ? PT Duration 12 weeks   ? PT Treatment/Interventions ADLs/Self Care Home Management;Biofeedback;Cryotherapy;Electrical Stimulation;Moist Heat;Therapeutic activities;Therapeutic exercise;Neuromuscular re-education;Patient/family education;Orthotic Fit/Training;Manual techniques;Scar mobilization;Dry needling;Taping;Spinal Manipulations;Joint Manipulations   ? PT Home Exercise Plan [email or web address]   ? Consulted and Agree with Plan of Care  Patient   ? ?  ?  ? ?  ? ?Myles Gip PT, DPT [phone]  ?12/15/2021, 9:43 AM ? ?  ? ?

## 2022-01-06 DIAGNOSIS — S0240FA Zygomatic fracture, left side, initial encounter for closed fracture: Secondary | ICD-10-CM | POA: Diagnosis not present

## 2022-03-01 DIAGNOSIS — D229 Melanocytic nevi, unspecified: Secondary | ICD-10-CM | POA: Diagnosis not present

## 2022-03-01 DIAGNOSIS — L814 Other melanin hyperpigmentation: Secondary | ICD-10-CM | POA: Diagnosis not present

## 2022-03-01 DIAGNOSIS — D1801 Hemangioma of skin and subcutaneous tissue: Secondary | ICD-10-CM | POA: Diagnosis not present

## 2022-03-01 DIAGNOSIS — L821 Other seborrheic keratosis: Secondary | ICD-10-CM | POA: Diagnosis not present

## 2022-03-01 DIAGNOSIS — L818 Other specified disorders of pigmentation: Secondary | ICD-10-CM | POA: Diagnosis not present

## 2022-03-01 DIAGNOSIS — Z86018 Personal history of other benign neoplasm: Secondary | ICD-10-CM | POA: Diagnosis not present

## 2022-03-15 DIAGNOSIS — F3181 Bipolar II disorder: Secondary | ICD-10-CM | POA: Diagnosis not present

## 2022-04-18 ENCOUNTER — Other Ambulatory Visit: Payer: Self-pay | Admitting: Obstetrics and Gynecology

## 2022-04-18 DIAGNOSIS — Z1231 Encounter for screening mammogram for malignant neoplasm of breast: Secondary | ICD-10-CM

## 2022-05-31 ENCOUNTER — Ambulatory Visit
Admission: RE | Admit: 2022-05-31 | Discharge: 2022-05-31 | Disposition: A | Discharge status: Home / Self Care | Payer: BC Managed Care – PPO | Source: Ambulatory Visit | Attending: Obstetrics and Gynecology | Admitting: Obstetrics and Gynecology

## 2022-05-31 DIAGNOSIS — Z1231 Encounter for screening mammogram for malignant neoplasm of breast: Secondary | ICD-10-CM

## 2022-06-29 ENCOUNTER — Encounter: Payer: BC Managed Care – PPO | Admitting: Family Medicine

## 2022-12-08 DIAGNOSIS — E785 Hyperlipidemia, unspecified: Secondary | ICD-10-CM | POA: Diagnosis not present

## 2022-12-19 DIAGNOSIS — H5213 Myopia, bilateral: Secondary | ICD-10-CM | POA: Diagnosis not present

## 2023-01-19 DIAGNOSIS — M26623 Arthralgia of bilateral temporomandibular joint: Secondary | ICD-10-CM | POA: Diagnosis not present

## 2023-01-19 DIAGNOSIS — H9313 Tinnitus, bilateral: Secondary | ICD-10-CM | POA: Diagnosis not present

## 2023-01-19 DIAGNOSIS — J302 Other seasonal allergic rhinitis: Secondary | ICD-10-CM | POA: Diagnosis not present

## 2023-02-13 ENCOUNTER — Other Ambulatory Visit: Payer: Self-pay | Admitting: Obstetrics and Gynecology

## 2023-02-13 DIAGNOSIS — Z1231 Encounter for screening mammogram for malignant neoplasm of breast: Secondary | ICD-10-CM

## 2023-02-14 DIAGNOSIS — H43812 Vitreous degeneration, left eye: Secondary | ICD-10-CM | POA: Diagnosis not present

## 2023-03-15 DIAGNOSIS — Z1211 Encounter for screening for malignant neoplasm of colon: Secondary | ICD-10-CM | POA: Diagnosis not present

## 2023-03-15 DIAGNOSIS — R197 Diarrhea, unspecified: Secondary | ICD-10-CM | POA: Diagnosis not present

## 2023-06-06 ENCOUNTER — Ambulatory Visit: Payer: BC Managed Care – PPO

## 2023-06-27 ENCOUNTER — Ambulatory Visit: Payer: BC Managed Care – PPO

## 2023-07-18 ENCOUNTER — Inpatient Hospital Stay
Admission: RE | Admit: 2023-07-18 | Discharge: 2023-07-18 | Discharge status: Home / Self Care | Payer: BC Managed Care – PPO | Source: Ambulatory Visit | Attending: Obstetrics and Gynecology | Admitting: Obstetrics and Gynecology

## 2023-07-18 ENCOUNTER — Ambulatory Visit: Payer: BC Managed Care – PPO

## 2023-07-18 DIAGNOSIS — Z1231 Encounter for screening mammogram for malignant neoplasm of breast: Secondary | ICD-10-CM | POA: Diagnosis not present

## 2024-03-21 ENCOUNTER — Ambulatory Visit
Admission: RE | Admit: 2024-03-21 | Discharge: 2024-03-21 | Disposition: A | Discharge status: Home / Self Care | Source: Ambulatory Visit | Attending: Emergency Medicine | Admitting: Emergency Medicine

## 2024-03-21 VITALS — BP 116/77 | HR 79 | Temp 98.5°F | Resp 14 | Ht 63.0 in | Wt 128.7 lb

## 2024-03-21 DIAGNOSIS — J020 Streptococcal pharyngitis: Secondary | ICD-10-CM | POA: Diagnosis not present

## 2024-03-21 LAB — GROUP A STREP BY PCR: Group A Strep by PCR: DETECTED — AB

## 2024-03-21 MED ORDER — PENICILLIN G BENZATHINE 1200000 UNIT/2ML IM SUSY
1.2000 10*6.[IU] | PREFILLED_SYRINGE | Freq: Once | INTRAMUSCULAR | Status: AC
  Administered 2024-03-21: 1.2 10*6.[IU] via INTRAMUSCULAR

## 2024-03-21 NOTE — ED Triage Notes (Signed)
 Patient states that she was not feeling good on Tuesday.  Patient states that she developed a sore throat this morning.  Patient unsure of fevers.

## 2024-03-21 NOTE — ED Provider Notes (Signed)
 [facility]    CSN: [id number] Arrival date & time: 03/21/24  1336      History   Chief Complaint Chief Complaint  Patient presents with   Sore Throat    Appointment    HPI Tanya Cochran is a 53 y.o. female.   HPI  53 year old female with past medical history significant for fibromyalgia, MRSA, IBS, bipolar disorder, and chronic fatigue who presents for evaluation of feeling poorly for the last 3 days with a sore throat that began yesterday.  She reports that when she woke up this morning she felt like she was swallowing glass.  She denies any fever, runny nose nasal congestion, or cough.  She does describe feeling of burning in her chest.  Her daughter and several of her friends were recently treated for strep.  One of her daughters friends also tested positive for mono.  Patient is unaware of the direct exposure to the mono positive person.  Past Medical History:  Diagnosis Date   Bipolar disorder (HCC)    COVID-19 10/29/2020   Fibromyalgia    History of methicillin resistant staphylococcus aureus (MRSA)    YEARS AGO FROM SPIDER BITE   Irritable bowel syndrome (IBS)     Patient Active Problem List   Diagnosis Date Noted   Menorrhagia with irregular cycle 01/13/2021   Cervical polyp 01/13/2021   Cervical stenosis (uterine cervix) 01/13/2021   Pelvic pain 01/13/2021   Hyperlipidemia 07/23/2020   Avitaminosis D 06/04/2019   Chronic fatigue 06/04/2019   Perimenopausal 06/04/2019   Leg cramps 06/04/2019   Irritable bowel syndrome 06/30/2015   Fibromyalgia 06/30/2015   H/O manic depressive disorder 06/30/2015    Past Surgical History:  Procedure Laterality Date   AUGMENTATION MAMMAPLASTY  09/04/2008   BREAST ENHANCEMENT SURGERY  2010   CYSTOSCOPY N/A 01/13/2021   Procedure: CYSTOSCOPY;  Surgeon: Leonce Garnette BIRCH, MD;  Location: [facility];  Service: Gynecology;  Laterality: N/A;   DILATION AND CURETTAGE OF UTERUS  2006   laproscropic ovarion cyst  1999    TOTAL LAPAROSCOPIC HYSTERECTOMY WITH SALPINGECTOMY Bilateral 01/13/2021   Procedure: TOTAL LAPAROSCOPIC HYSTERECTOMY WITH SALPINGECTOMY;  Surgeon: Leonce Garnette BIRCH, MD;  Location: [facility];  Service: Gynecology;  Laterality: Bilateral;   WISDOM TOOTH EXTRACTION  1995    OB History     Gravida  3   Para  2   Term  2   Preterm      AB  1   Living  2      SAB      IAB      Ectopic  1   Multiple      Live Births               Home Medications    Prior to Admission medications   Medication Sig Start Date End Date Taking? Authorizing Provider  cholecalciferol (VITAMIN D ) 25 MCG (1000 UNIT) tablet Take 1,000 Units by mouth daily.    [provider]  docusate sodium (COLACE) 100 MG capsule Take 100 mg by mouth daily. Patient not taking: Reported on 11/10/2021    [provider]  estradiol (CLIMARA - DOSED IN MG/24 HR) 0.05 mg/24hr patch Place 0.05 mg onto the skin once a week.    [provider]  ibuprofen  (ADVIL ) 600 MG tablet Take 1 tablet (600 mg total) by mouth every 6 (six) hours. Patient not taking: Reported on 11/10/2021 01/13/21   Leonce Garnette BIRCH, MD  lamoTRIgine (LAMICTAL) 200  MG tablet Take 200 mg by mouth at bedtime.    [provider]  loratadine (CLARITIN) 10 MG tablet Take 10 mg by mouth daily as needed for allergies.    [provider]  Multiple Vitamins-Minerals (MULTIVITAMIN WITH MINERALS) tablet Take 1 tablet by mouth daily. Woman    [provider]  QUEtiapine Fumarate (SEROQUEL XR) 150 MG 24 hr tablet Take 150 mg by mouth at bedtime. 04/15/18   [provider]    Family History Family History  Problem Relation Age of Onset   Diabetes Mother        non-insulin dependent   Aortic aneurysm Mother    Heart disease Father    Atrial fibrillation Father    ALS Maternal Grandmother    Aortic aneurysm Maternal Grandfather    Prostate cancer Maternal Grandfather        prostate   Stroke  Paternal Grandmother    Aortic aneurysm Paternal Grandfather    Colon cancer Neg Hx    Breast cancer Neg Hx    Ovarian cancer Neg Hx     Social History Social History   Tobacco Use   Smoking status: Never   Smokeless tobacco: Never  Vaping Use   Vaping status: Never Used  Substance Use Topics   Alcohol use: Yes    Alcohol/week: 0.0 standard drinks of alcohol    Comment: occasional   Drug use: No     Allergies   Patient has no known allergies.   Review of Systems Review of Systems  Constitutional:  Negative for fever.  HENT:  Positive for sore throat. Negative for congestion, ear pain and rhinorrhea.   Respiratory:  Negative for cough.   Cardiovascular:        Burning in chest     Physical Exam Triage Vital Signs ED Triage Vitals  Encounter Vitals Group     BP      Girls Systolic BP Percentile      Girls Diastolic BP Percentile      Boys Systolic BP Percentile      Boys Diastolic BP Percentile      Pulse      Resp      Temp      Temp src      SpO2      Weight      Height      Head Circumference      Peak Flow      Pain Score      Pain Loc      Pain Education      Exclude from Growth Chart    No data found.  Updated Vital Signs BP 116/77 (BP Location: Left Arm)   Pulse 79   Temp 98.5 F (36.9 C) (Oral)   Resp 14   Ht 5' 3 (1.6 m)   Wt 128 lb 12 oz (58.4 kg)   LMP 11/01/2020 (Approximate)   SpO2 97%   BMI 22.81 kg/m   Visual Acuity Right Eye Distance:   Left Eye Distance:   Bilateral Distance:    Right Eye Near:   Left Eye Near:    Bilateral Near:     Physical Exam Vitals and nursing note reviewed.  Constitutional:      Appearance: Normal appearance. She is not ill-appearing.  HENT:     Head: Normocephalic and atraumatic.     Mouth/Throat:     Mouth: Mucous membranes are moist.     Pharynx: Oropharynx is clear. Posterior  oropharyngeal erythema present. No oropharyngeal exudate.     Comments: Tonsillar pillars are unremarkable.   Soft palate has erythema and posterior oropharynx is erythema with mild injection.  No appreciable exudate. Cardiovascular:     Rate and Rhythm: Normal rate and regular rhythm.     Pulses: Normal pulses.     Heart sounds: Normal heart sounds. No murmur heard.    No friction rub. No gallop.  Pulmonary:     Effort: Pulmonary effort is normal.     Breath sounds: Normal breath sounds. No wheezing, rhonchi or rales.  Musculoskeletal:     Cervical back: Normal range of motion and neck supple. No tenderness.  Lymphadenopathy:     Cervical: No cervical adenopathy.  Skin:    General: Skin is warm and dry.     Capillary Refill: Capillary refill takes less than 2 seconds.     Findings: No rash.  Neurological:     General: No focal deficit present.     Mental Status: She is alert and oriented to person, place, and time.      UC Treatments / Results  Labs (all labs ordered are listed, but only abnormal results are displayed) Labs Reviewed  GROUP A STREP BY PCR - Abnormal; Notable for the following components:      Result Value   Group A Strep by PCR DETECTED (*)    All other components within normal limits    EKG   Radiology No results found.  Procedures Procedures (including critical care time)  Medications Ordered in UC Medications - No data to display  Initial Impression / Assessment and Plan / UC Course  I have reviewed the triage vital signs and the nursing notes.  Pertinent labs & imaging results that were available during my care of the patient were reviewed by me and considered in my medical decision making (see chart for details).   Patient is a nontoxic-appearing 53 year old female presenting for evaluation of sore throat without other associated upper or lower respiratory symptoms.  Her daughter was treated for strep several weeks ago and several of her daughters friends have recently had strep.  One of her daughters friends also had mono but she denies any direct  contact with this person.  She came in today because when she woke up she states she feels like she is swallowing glass.  On exam she does have erythema and injection to the posterior oropharynx but her tonsillar pillars are unremarkable.  She has no cervical lymphadenopathy on exam.  Cardiopulmonary exam is benign.  Differential diagnosis include strep pharyngitis versus viral pharyngitis.  I will order a strep PCR.  Strep PCR is positive.  I have advised patient that she is positive for strep and we discussed antibiotic therapy.  I asked her if she would prefer a 10-day oral course of antibiotics or a one-time injection and she prefers the one-time injection.  I will order 1,200,000 units of Bicillin LA to be given IM and discharge patient home.   Final Clinical Impressions(s) / UC Diagnoses   Final diagnoses:  Strep pharyngitis     Discharge Instructions      You have been given a one-time injection of antibiotics for your strep throat.  You will not need to take any further antibiotics going forward.  Gargle with warm salt water 2-3 times a day to soothe your throat, aid in pain relief, and aid in healing.  Take over-the-counter Tylenol  and/or ibuprofen  according to the package instructions as  needed for pain.  You can also use Chloraseptic or Sucrets lozenges, 1 lozenge every 2 hours as needed for throat pain.  If you develop any new or worsening symptoms return for reevaluation.      ED Prescriptions   None    PDMP not reviewed this encounter.   Bernardino Ditch, NP 03/21/24 1431

## 2024-03-21 NOTE — Discharge Instructions (Addendum)
 You have been given a one-time injection of antibiotics for your strep throat.  You will not need to take any further antibiotics going forward.  Gargle with warm salt water 2-3 times a day to soothe your throat, aid in pain relief, and aid in healing.  Take over-the-counter Tylenol  and/or ibuprofen  according to the package instructions as needed for pain.  You can also use Chloraseptic or Sucrets lozenges, 1 lozenge every 2 hours as needed for throat pain.  If you develop any new or worsening symptoms return for reevaluation.

## 2024-06-04 ENCOUNTER — Other Ambulatory Visit: Payer: Self-pay | Admitting: Obstetrics and Gynecology

## 2024-06-04 DIAGNOSIS — Z0000 Encounter for general adult medical examination without abnormal findings: Secondary | ICD-10-CM

## 2024-06-17 ENCOUNTER — Encounter: Payer: Self-pay | Admitting: Emergency Medicine

## 2024-06-17 ENCOUNTER — Ambulatory Visit
Admission: EM | Admit: 2024-06-17 | Discharge: 2024-06-17 | Disposition: A | Discharge status: Home / Self Care | Attending: Emergency Medicine | Admitting: Emergency Medicine

## 2024-06-17 DIAGNOSIS — N3001 Acute cystitis with hematuria: Secondary | ICD-10-CM | POA: Diagnosis not present

## 2024-06-17 DIAGNOSIS — B3731 Acute candidiasis of vulva and vagina: Secondary | ICD-10-CM | POA: Diagnosis present

## 2024-06-17 LAB — URINALYSIS, W/ REFLEX TO CULTURE (INFECTION SUSPECTED)
Bilirubin Urine: NEGATIVE
Glucose, UA: 100 mg/dL — AB
Ketones, ur: NEGATIVE mg/dL
Nitrite: POSITIVE — AB
Protein, ur: 30 mg/dL — AB
RBC / HPF: >50 RBC/hpf (ref 0–5)
Specific Gravity, Urine: 1.02 (ref 1.005–1.030)
WBC, UA: >50 WBC/hpf (ref 0–5)
pH: 5.5 (ref 5.0–8.0)

## 2024-06-17 MED ORDER — MICONAZOLE NITRATE 2 % VA CREA: 1.0000 | TOPICAL_CREAM | Freq: Every day | VAGINAL | 0 refills | Status: AC

## 2024-06-17 MED ORDER — NITROFURANTOIN MONOHYD MACRO 100 MG PO CAPS: 100.0000 mg | ORAL_CAPSULE | Freq: Two times a day (BID) | ORAL | 0 refills | Status: AC

## 2024-06-17 MED ORDER — PHENAZOPYRIDINE HCL 200 MG PO TABS: 200.0000 mg | ORAL_TABLET | Freq: Three times a day (TID) | ORAL | 0 refills | Status: AC

## 2024-06-17 NOTE — ED Provider Notes (Signed)
 [facility]    CSN: [id number] Arrival date & time: 06/17/24  0815      History   Chief Complaint No chief complaint on file.   HPI Tanya Cochran is a 53 y.o. female.   HPI  53 year old female with past medical history significant MRSA, bipolar disorder, fibromyalgia, IBS presents for evaluation of urinary symptoms that began yesterday evening.  This includes painful urination with urinary urgency and frequency, nocturia, and low back pain.  She has increased her water intake and has been using cranberry supplements without improvement of symptoms.  She did take Azo last night out of desperation due to pain.  She denies any fever, nausea or vomiting, or blood in her urine.  She reports she has been taking baths recently which may have contributed to the symptoms.  She is using topical vaginal estrogen but reports she still has a lot of dyspareunia and vaginal dryness.  Past Medical History:  Diagnosis Date   Bipolar disorder (HCC)    COVID-19 10/29/2020   Fibromyalgia    History of methicillin resistant staphylococcus aureus (MRSA)    YEARS AGO FROM SPIDER BITE   Irritable bowel syndrome (IBS)     Patient Active Problem List   Diagnosis Date Noted   Menorrhagia with irregular cycle 01/13/2021   Cervical polyp 01/13/2021   Cervical stenosis (uterine cervix) 01/13/2021   Pelvic pain 01/13/2021   Hyperlipidemia 07/23/2020   Avitaminosis D 06/04/2019   Chronic fatigue 06/04/2019   Perimenopausal 06/04/2019   Leg cramps 06/04/2019   Irritable bowel syndrome 06/30/2015   Fibromyalgia 06/30/2015   H/O manic depressive disorder 06/30/2015    Past Surgical History:  Procedure Laterality Date   AUGMENTATION MAMMAPLASTY  09/04/2008   BREAST ENHANCEMENT SURGERY  2010   CYSTOSCOPY N/A 01/13/2021   Procedure: CYSTOSCOPY;  Surgeon: Leonce Garnette BIRCH, MD;  Location: [facility];  Service: Gynecology;  Laterality: N/A;   DILATION AND CURETTAGE OF UTERUS  2006    laproscropic ovarion cyst  1999   TOTAL LAPAROSCOPIC HYSTERECTOMY WITH SALPINGECTOMY Bilateral 01/13/2021   Procedure: TOTAL LAPAROSCOPIC HYSTERECTOMY WITH SALPINGECTOMY;  Surgeon: Leonce Garnette BIRCH, MD;  Location: [facility];  Service: Gynecology;  Laterality: Bilateral;   WISDOM TOOTH EXTRACTION  1995    OB History     Gravida  3   Para  2   Term  2   Preterm      AB  1   Living  2      SAB      IAB      Ectopic  1   Multiple      Live Births               Home Medications    Prior to Admission medications   Medication Sig Start Date End Date Taking? Authorizing Provider  cholecalciferol (VITAMIN D ) 25 MCG (1000 UNIT) tablet Take 1,000 Units by mouth daily.   Yes [provider]  estradiol (CLIMARA - DOSED IN MG/24 HR) 0.05 mg/24hr patch Place 0.05 mg onto the skin once a week.   Yes [provider]  lamoTRIgine (LAMICTAL) 200 MG tablet Take 200 mg by mouth at bedtime.   Yes [provider]  loratadine (CLARITIN) 10 MG tablet Take 10 mg by mouth daily as needed for allergies.   Yes [provider]  miconazole (MONISTAT 7) 2 % vaginal cream Place 1 Applicatorful vaginally at bedtime. 06/17/24  Yes Bernardino Ditch, NP  Multiple Vitamins-Minerals (MULTIVITAMIN  WITH MINERALS) tablet Take 1 tablet by mouth daily. Woman   Yes [provider]  nitrofurantoin, macrocrystal-monohydrate, (MACROBID) 100 MG capsule Take 1 capsule (100 mg total) by mouth 2 (two) times daily. 06/17/24  Yes Bernardino Ditch, NP  phenazopyridine (PYRIDIUM) 200 MG tablet Take 1 tablet (200 mg total) by mouth 3 (three) times daily. 06/17/24  Yes Bernardino Ditch, NP  QUEtiapine Fumarate (SEROQUEL XR) 150 MG 24 hr tablet Take 150 mg by mouth at bedtime. 04/15/18  Yes [provider]    Family History Family History  Problem Relation Age of Onset   Diabetes Mother        non-insulin dependent   Aortic aneurysm Mother    Heart disease Father    Atrial  fibrillation Father    ALS Maternal Grandmother    Aortic aneurysm Maternal Grandfather    Prostate cancer Maternal Grandfather        prostate   Stroke Paternal Grandmother    Aortic aneurysm Paternal Grandfather    Colon cancer Neg Hx    Breast cancer Neg Hx    Ovarian cancer Neg Hx     Social History Social History   Tobacco Use   Smoking status: Never   Smokeless tobacco: Never  Vaping Use   Vaping status: Never Used  Substance Use Topics   Alcohol use: Yes    Alcohol/week: 0.0 standard drinks of alcohol    Comment: occasional   Drug use: No     Allergies   Patient has no known allergies.   Review of Systems Review of Systems  Constitutional:  Negative for fever.  Gastrointestinal:  Negative for nausea and vomiting.  Genitourinary:  Positive for dysuria, frequency and urgency. Negative for hematuria.  Musculoskeletal:  Positive for back pain.     Physical Exam Triage Vital Signs ED Triage Vitals  Encounter Vitals Group     BP      Girls Systolic BP Percentile      Girls Diastolic BP Percentile      Boys Systolic BP Percentile      Boys Diastolic BP Percentile      Pulse      Resp      Temp      Temp src      SpO2      Weight      Height      Head Circumference      Peak Flow      Pain Score      Pain Loc      Pain Education      Exclude from Growth Chart    No data found.  Updated Vital Signs BP 109/73 (BP Location: Left Arm)   Pulse 76   Temp 98.5 F (36.9 C) (Oral)   Resp 16   LMP 11/01/2020 (Approximate)   SpO2 95%   Visual Acuity Right Eye Distance:   Left Eye Distance:   Bilateral Distance:    Right Eye Near:   Left Eye Near:    Bilateral Near:     Physical Exam Vitals and nursing note reviewed.  Constitutional:      Appearance: Normal appearance. She is not ill-appearing.  HENT:     Head: Normocephalic and atraumatic.  Cardiovascular:     Rate and Rhythm: Normal rate and regular rhythm.     Pulses: Normal pulses.      Heart sounds: Normal heart sounds. No murmur heard.    No friction rub. No gallop.  Pulmonary:     Effort: Pulmonary effort is normal.     Breath sounds: Normal breath sounds. No wheezing, rhonchi or rales.  Abdominal:     Tenderness: There is no right CVA tenderness or left CVA tenderness.  Skin:    General: Skin is warm and dry.     Capillary Refill: Capillary refill takes less than 2 seconds.     Findings: No rash.  Neurological:     General: No focal deficit present.     Mental Status: She is alert and oriented to person, place, and time.      UC Treatments / Results  Labs (all labs ordered are listed, but only abnormal results are displayed) Labs Reviewed  URINALYSIS, W/ REFLEX TO CULTURE (INFECTION SUSPECTED) - Abnormal; Notable for the following components:      Result Value   Color, Urine AMBER (*)    APPearance CLOUDY (*)    Glucose, UA 100 (*)    Hgb urine dipstick MODERATE (*)    Protein, ur 30 (*)    Nitrite POSITIVE (*)    Leukocytes,Ua MODERATE (*)    Bacteria, UA MANY (*)    All other components within normal limits  URINE CULTURE    EKG   Radiology No results found.  Procedures Procedures (including critical care time)  Medications Ordered in UC Medications - No data to display  Initial Impression / Assessment and Plan / UC Course  I have reviewed the triage vital signs and the nursing notes.  Pertinent labs & imaging results that were available during my care of the patient were reviewed by me and considered in my medical decision making (see chart for details).   Patient is a pleasant, nontoxic-appearing 53 year old female presenting for evaluation of UTI symptoms that started last night.  She reports that she has a lot of ongoing vaginal issues to include thinning, irritation, and dyspareunia.  She is on topical vaginal estrogen as well as on the estrogen patch.  She reports that she has been taking baths recently which she thinks may have  contributed to the urinary tract infections.  I will order a urinalysis to assess for the presence of UTI.  Urinalysis has an amber color with cloudy appearance, 100 glucose, moderate hemoglobin, 30 protein, nitrite positive with moderate leukocyte esterase.  Reflex microscopy shows greater than 50 WBCs, greater than 50 RBCs, many bacteria, and budding yeast present.  Urine will reflex to culture.  I will discharge patient on the diagnosis of urinary tract infection and start her on Macrobid 100 mg twice daily for 5 days along with Pyridium 200 mg every 8 hours to help with urinary discomfort.  Given that she has budding yeast in her urine I will also start her on Monistat daily at bedtime for 7 days.  She is on Seroquel and Diflucan  can cause QT prolongation.  Furthermore, I will suggest that she talk to her PCP about having her testosterone levels checked and inquire about adding testosterone on to help with her GSM symptoms.   Final Clinical Impressions(s) / UC Diagnoses   Final diagnoses:  Acute cystitis with hematuria  Vaginal yeast infection     Discharge Instructions      Take the Macrobid twice daily for 5 days with food for treatment of urinary tract infection.  Use the Pyridium every 8 hours as needed for urinary discomfort.  This will turn your urine a bright red-orange.  Increase your oral fluid intake so that you increase  your urine production and or flushing your urinary system.  Take an over-the-counter probiotic, such as Culturelle-Align-Activia, 1 hour after each dose of antibiotic to prevent diarrhea or yeast infections from forming.  We will culture urine and change the antibiotics if necessary.  There were also budding yeast seen with your urine was examined under microscope which may indicate that you are also developing a vaginal yeast infection.  Begin taking the Monistat intravaginal cream daily at bedtime for the next 7 days.  You will instill 1 applicatorful  each night at bedtime to help treat the yeast infection.  Given that you are on transdermal estrogen and topical vaginal estrogen therapy and are still continue to experience vaginal irritation and dyspareunia you may want to talk with your primary care doctor about having your testosterone levels checked and possibly starting testosterone supplementation as this can improve lubrication and elasticity of the vaginal tissues and help decrease some of the irritation caused from postmenopausal changes.  Return for reevaluation, or see your primary care provider, for any new or worsening symptoms.      ED Prescriptions     Medication Sig Dispense Auth. Provider   nitrofurantoin, macrocrystal-monohydrate, (MACROBID) 100 MG capsule Take 1 capsule (100 mg total) by mouth 2 (two) times daily. 10 capsule Bernardino Ditch, NP   phenazopyridine (PYRIDIUM) 200 MG tablet Take 1 tablet (200 mg total) by mouth 3 (three) times daily. 6 tablet Bernardino Ditch, NP   miconazole (MONISTAT 7) 2 % vaginal cream Place 1 Applicatorful vaginally at bedtime. 45 g Bernardino Ditch, NP      PDMP not reviewed this encounter.   Bernardino Ditch, NP 06/17/24 [phone]

## 2024-06-17 NOTE — Discharge Instructions (Addendum)
 Take the Macrobid twice daily for 5 days with food for treatment of urinary tract infection.  Use the Pyridium every 8 hours as needed for urinary discomfort.  This will turn your urine a bright red-orange.  Increase your oral fluid intake so that you increase your urine production and or flushing your urinary system.  Take an over-the-counter probiotic, such as Culturelle-Align-Activia, 1 hour after each dose of antibiotic to prevent diarrhea or yeast infections from forming.  We will culture urine and change the antibiotics if necessary.  There were also budding yeast seen with your urine was examined under microscope which may indicate that you are also developing a vaginal yeast infection.  Begin taking the Monistat intravaginal cream daily at bedtime for the next 7 days.  You will instill 1 applicatorful each night at bedtime to help treat the yeast infection.  Given that you are on transdermal estrogen and topical vaginal estrogen therapy and are still continue to experience vaginal irritation and dyspareunia you may want to talk with your primary care doctor about having your testosterone levels checked and possibly starting testosterone supplementation as this can improve lubrication and elasticity of the vaginal tissues and help decrease some of the irritation caused from postmenopausal changes.  Return for reevaluation, or see your primary care provider, for any new or worsening symptoms.

## 2024-06-19 LAB — URINE CULTURE: Culture: >=100000 — AB

## 2024-06-20 ENCOUNTER — Ambulatory Visit (HOSPITAL_COMMUNITY): Payer: Self-pay

## 2024-06-27 ENCOUNTER — Encounter: Payer: Self-pay | Admitting: Emergency Medicine

## 2024-06-27 ENCOUNTER — Ambulatory Visit
Admission: EM | Admit: 2024-06-27 | Discharge: 2024-06-27 | Disposition: A | Discharge status: Home / Self Care | Attending: Family Medicine | Admitting: Family Medicine

## 2024-06-27 DIAGNOSIS — J029 Acute pharyngitis, unspecified: Secondary | ICD-10-CM | POA: Insufficient documentation

## 2024-06-27 DIAGNOSIS — B349 Viral infection, unspecified: Secondary | ICD-10-CM | POA: Insufficient documentation

## 2024-06-27 LAB — RESP PANEL BY RT-PCR (FLU A&B, COVID) ARPGX2
Influenza A by PCR: NEGATIVE
Influenza B by PCR: NEGATIVE
SARS Coronavirus 2 by RT PCR: NEGATIVE

## 2024-06-27 LAB — GROUP A STREP BY PCR: Group A Strep by PCR: NOT DETECTED

## 2024-06-27 NOTE — ED Triage Notes (Signed)
 Pt presents c/o sore throat x 2 days. Pt states,  Well my taste started being off a lil while ago and then the sore throat started. Feels like I'm swallowing glass. I also have this weird taste in my mouth. I've had yeast in my mouth before and had to use a weird mouth wash. This sorta feels similar especially around my lips. Pt denies otalgia.

## 2024-06-27 NOTE — ED Provider Notes (Signed)
 [facility]    CSN: [id number] Arrival date & time: 06/27/24  1410      History   Chief Complaint Chief Complaint  Patient presents with   Sore Throat    HPI Tanya Cochran is a 53 y.o. female  presents for evaluation of URI symptoms for 2 days. Patient reports associated symptoms of congestion with sore throat.  Also states food taste weird but denies loss of taste.  Denies N/V/D, cough, fevers, ear pain, body aches, shortness of breath. Patient does not in have a hx of asthma. Patient is not an active smoker.   Reports daughter has similar symptoms.  He was seen  in urgent care on 10/14 for UTI that resolved after treatment with Macrobid.  Pt has taken nothing OTC for symptoms. Pt has no other concerns at this time.    Sore Throat    Past Medical History:  Diagnosis Date   Bipolar disorder (HCC)    COVID-19 10/29/2020   Fibromyalgia    History of methicillin resistant staphylococcus aureus (MRSA)    YEARS AGO FROM SPIDER BITE   Irritable bowel syndrome (IBS)     Patient Active Problem List   Diagnosis Date Noted   Menorrhagia with irregular cycle 01/13/2021   Cervical polyp 01/13/2021   Cervical stenosis (uterine cervix) 01/13/2021   Pelvic pain 01/13/2021   Hyperlipidemia 07/23/2020   Avitaminosis D 06/04/2019   Chronic fatigue 06/04/2019   Perimenopausal 06/04/2019   Leg cramps 06/04/2019   Irritable bowel syndrome 06/30/2015   Fibromyalgia 06/30/2015   H/O manic depressive disorder 06/30/2015    Past Surgical History:  Procedure Laterality Date   AUGMENTATION MAMMAPLASTY  09/04/2008   BREAST ENHANCEMENT SURGERY  2010   CYSTOSCOPY N/A 01/13/2021   Procedure: CYSTOSCOPY;  Surgeon: Leonce Garnette BIRCH, MD;  Location: [facility];  Service: Gynecology;  Laterality: N/A;   DILATION AND CURETTAGE OF UTERUS  2006   laproscropic ovarion cyst  1999   TOTAL LAPAROSCOPIC HYSTERECTOMY WITH SALPINGECTOMY Bilateral 01/13/2021   Procedure: TOTAL LAPAROSCOPIC  HYSTERECTOMY WITH SALPINGECTOMY;  Surgeon: Leonce Garnette BIRCH, MD;  Location: [facility];  Service: Gynecology;  Laterality: Bilateral;   WISDOM TOOTH EXTRACTION  1995    OB History     Gravida  3   Para  2   Term  2   Preterm      AB  1   Living  2      SAB      IAB      Ectopic  1   Multiple      Live Births               Home Medications    Prior to Admission medications   Medication Sig Start Date End Date Taking? Authorizing Provider  fluconazole  (DIFLUCAN ) 150 MG tablet Take by mouth. 04/14/24  Yes [provider]  ibuprofen  (ADVIL ) 200 MG tablet Take 200 mg by mouth every 6 (six) hours as needed.   Yes [provider]  loteprednol (LOTEMAX) 0.2 % SUSP SMARTSIG:1 Drop(s) In Eye(s) 3 Times Daily PRN 03/06/24  Yes [provider]  nystatin ointment (MYCOSTATIN) Apply topically. 07/16/19  Yes [provider]  prednisoLONE acetate (PRED FORTE) 1 % ophthalmic suspension 1 DROP BOTH EYES 3 TIMES A DAY FOR SEVERAL DAYS THEN DECREASE TO TWICE A DAY AS NEEDED FOR ALLERGIES 01/02/24  Yes [provider]  rosuvastatin (CRESTOR) 5 MG tablet Take 5 mg by mouth. 02/28/24 02/27/25 Yes  [provider]  triamcinolone ointment (KENALOG) 0.1 % Apply topically 2 (two) times daily as needed. 04/14/24  Yes [provider]  ascorbic acid (VITAMIN C) 500 MG tablet Take 500 mg by mouth.    [provider]  cholecalciferol (VITAMIN D ) 25 MCG (1000 UNIT) tablet Take 1,000 Units by mouth daily.    [provider]  cyanocobalamin  (VITAMIN B12) 1000 MCG tablet Take 1,000 mcg by mouth.    [provider]  estradiol (CLIMARA - DOSED IN MG/24 HR) 0.05 mg/24hr patch Place 0.05 mg onto the skin once a week.    [provider]  lamoTRIgine (LAMICTAL) 200 MG tablet Take 200 mg by mouth at bedtime.    [provider]  loratadine (CLARITIN) 10 MG tablet Take 10 mg by mouth daily as needed for  allergies.    [provider]  miconazole (MONISTAT 7) 2 % vaginal cream Place 1 Applicatorful vaginally at bedtime. 06/17/24   Bernardino Ditch, NP  Multiple Vitamins-Minerals (MULTIVITAMIN WITH MINERALS) tablet Take 1 tablet by mouth daily. Woman    [provider]  nitrofurantoin, macrocrystal-monohydrate, (MACROBID) 100 MG capsule Take 1 capsule (100 mg total) by mouth 2 (two) times daily. 06/17/24   Bernardino Ditch, NP  phenazopyridine (PYRIDIUM) 200 MG tablet Take 1 tablet (200 mg total) by mouth 3 (three) times daily. 06/17/24   Bernardino Ditch, NP  QUEtiapine Fumarate (SEROQUEL XR) 150 MG 24 hr tablet Take 150 mg by mouth at bedtime. 04/15/18   [provider]  Vitamins-Lipotropics (MEGA MULTIPLE/CHELATED MINERAL) TABS Take by mouth.    [provider]    Family History Family History  Problem Relation Age of Onset   Diabetes Mother        non-insulin dependent   Aortic aneurysm Mother    Heart disease Father    Atrial fibrillation Father    ALS Maternal Grandmother    Aortic aneurysm Maternal Grandfather    Prostate cancer Maternal Grandfather        prostate   Stroke Paternal Grandmother    Aortic aneurysm Paternal Grandfather    Colon cancer Neg Hx    Breast cancer Neg Hx    Ovarian cancer Neg Hx     Social History Social History   Tobacco Use   Smoking status: Never    Passive exposure: Never   Smokeless tobacco: Never  Vaping Use   Vaping status: Never Used  Substance Use Topics   Alcohol use: Yes    Alcohol/week: 0.0 standard drinks of alcohol    Comment: occasional   Drug use: No     Allergies   Patient has no known allergies.   Review of Systems Review of Systems  HENT:  Positive for congestion and sore throat.      Physical Exam Triage Vital Signs ED Triage Vitals  Encounter Vitals Group     BP 06/27/24 1502 127/86     Girls Systolic BP Percentile --      Girls Diastolic BP Percentile --      Boys Systolic BP  Percentile --      Boys Diastolic BP Percentile --      Pulse Rate 06/27/24 1502 88     Resp 06/27/24 1502 16     Temp 06/27/24 1502 98.6 F (37 C)     Temp Source 06/27/24 1502 Oral     SpO2 06/27/24 1502 98 %     Weight 06/27/24 1501 128 lb 12 oz (58.4 kg)  Height --      Head Circumference --      Peak Flow --      Pain Score 06/27/24 1459 7     Pain Loc --      Pain Education --      Exclude from Growth Chart --    No data found.  Updated Vital Signs BP 127/86 (BP Location: Left Arm)   Pulse 88   Temp 98.6 F (37 C) (Oral)   Resp 16   Wt 128 lb 12 oz (58.4 kg)   LMP 11/01/2020 (Approximate)   SpO2 98%   BMI 22.81 kg/m   Visual Acuity Right Eye Distance:   Left Eye Distance:   Bilateral Distance:    Right Eye Near:   Left Eye Near:    Bilateral Near:     Physical Exam Vitals and nursing note reviewed.  Constitutional:      General: She is not in acute distress.    Appearance: She is well-developed. She is not ill-appearing.  HENT:     Head: Normocephalic and atraumatic.     Right Ear: Tympanic membrane and ear canal normal.     Left Ear: Tympanic membrane and ear canal normal.     Nose: Congestion present.     Mouth/Throat:     Mouth: Mucous membranes are moist.     Pharynx: Oropharynx is clear. Uvula midline. Posterior oropharyngeal erythema present.     Tonsils: No tonsillar exudate or tonsillar abscesses.     Comments: There is no white film or lesions in mouth.  No coating on tongue. Eyes:     Conjunctiva/sclera: Conjunctivae normal.     Pupils: Pupils are equal, round, and reactive to light.  Cardiovascular:     Rate and Rhythm: Normal rate and regular rhythm.     Heart sounds: Normal heart sounds.  Pulmonary:     Effort: Pulmonary effort is normal.     Breath sounds: Normal breath sounds. No wheezing or rhonchi.  Musculoskeletal:     Cervical back: Normal range of motion and neck supple.  Lymphadenopathy:     Cervical: No cervical  adenopathy.  Skin:    General: Skin is warm and dry.  Neurological:     General: No focal deficit present.     Mental Status: She is alert and oriented to person, place, and time.  Psychiatric:        Mood and Affect: Mood normal.        Behavior: Behavior normal.      UC Treatments / Results  Labs (all labs ordered are listed, but only abnormal results are displayed) Labs Reviewed  GROUP A STREP BY PCR  RESP PANEL BY RT-PCR (FLU A&B, COVID) [id number]    EKG   Radiology No results found.  Procedures Procedures (including critical care time)  Medications Ordered in UC Medications - No data to display  Initial Impression / Assessment and Plan / UC Course  I have reviewed the triage vital signs and the nursing notes.  Pertinent labs & imaging results that were available during my care of the patient were reviewed by me and considered in my medical decision making (see chart for details).     Reviewed exam and symptoms with patient.  No red flags.  Negative COVID, flu, strep throat PCR.  Discussed viral illness and symptomatic treatment.  Discussed no evidence of thrush on exam.  Advised PCP follow-up if symptoms do not improve.  ER precautions reviewed Final  Clinical Impressions(s) / UC Diagnoses   Final diagnoses:  Sore throat  Viral illness     Discharge Instructions      You tested negative for COVID, flu, and strep throat.  Please treat your symptoms with over the counter cough medication, tylenol  or ibuprofen , humidifier, and rest. Viral illnesses can last 7-14 days. Please follow up with your PCP if your symptoms are not improving. Please go to the ER for any worsening symptoms. This includes but is not limited to fever you can not control with tylenol  or ibuprofen , you are not able to stay hydrated, you have shortness of breath or chest pain.  Thank you for choosing  for your healthcare needs. I hope you feel better soon!      ED Prescriptions    None    PDMP not reviewed this encounter.   Loreda Myla SAUNDERS, NP 06/27/24 1622

## 2024-06-27 NOTE — Discharge Instructions (Addendum)
 You tested negative for COVID, flu, and strep throat.  Please treat your symptoms with over the counter cough medication, tylenol  or ibuprofen , humidifier, and rest. Viral illnesses can last 7-14 days. Please follow up with your PCP if your symptoms are not improving. Please go to the ER for any worsening symptoms. This includes but is not limited to fever you can not control with tylenol  or ibuprofen , you are not able to stay hydrated, you have shortness of breath or chest pain.  Thank you for choosing [facility] for your healthcare needs. I hope you feel better soon!

## 2024-07-23 ENCOUNTER — Ambulatory Visit
Admission: RE | Admit: 2024-07-23 | Discharge: 2024-07-23 | Disposition: A | Discharge status: Home / Self Care | Source: Ambulatory Visit | Attending: Obstetrics and Gynecology | Admitting: Obstetrics and Gynecology

## 2024-07-23 DIAGNOSIS — Z0000 Encounter for general adult medical examination without abnormal findings: Secondary | ICD-10-CM
